# Patient Record
Sex: Female | Born: 1995 | Race: Black or African American | Hispanic: No | Marital: Single | Smoking: Never smoker
Health system: Southern US, Community
[De-identification: ages and names within clinical notes are randomized; demographics above are authoritative.]

## PROBLEM LIST (undated history)

## (undated) DIAGNOSIS — G43909 Migraine, unspecified, not intractable, without status migrainosus: Secondary | ICD-10-CM

## (undated) DIAGNOSIS — R0600 Dyspnea, unspecified: Secondary | ICD-10-CM

## (undated) DIAGNOSIS — D6832 Hemorrhagic disorder due to extrinsic circulating anticoagulants: Secondary | ICD-10-CM

## (undated) DIAGNOSIS — F329 Major depressive disorder, single episode, unspecified: Secondary | ICD-10-CM

## (undated) DIAGNOSIS — D689 Coagulation defect, unspecified: Secondary | ICD-10-CM

## (undated) DIAGNOSIS — I82409 Acute embolism and thrombosis of unspecified deep veins of unspecified lower extremity: Secondary | ICD-10-CM

## (undated) DIAGNOSIS — F988 Other specified behavioral and emotional disorders with onset usually occurring in childhood and adolescence: Secondary | ICD-10-CM

## (undated) DIAGNOSIS — F32A Depression, unspecified: Secondary | ICD-10-CM

## (undated) DIAGNOSIS — D573 Sickle-cell trait: Secondary | ICD-10-CM

## (undated) DIAGNOSIS — N19 Unspecified kidney failure: Secondary | ICD-10-CM

## (undated) DIAGNOSIS — D6862 Lupus anticoagulant syndrome: Secondary | ICD-10-CM

## (undated) DIAGNOSIS — M419 Scoliosis, unspecified: Secondary | ICD-10-CM

## (undated) DIAGNOSIS — D649 Anemia, unspecified: Secondary | ICD-10-CM

## (undated) DIAGNOSIS — T45515A Adverse effect of anticoagulants, initial encounter: Secondary | ICD-10-CM

## (undated) HISTORY — DX: Acute embolism and thrombosis of unspecified deep veins of unspecified lower extremity: I82.409

## (undated) HISTORY — DX: Scoliosis, unspecified: M41.9

## (undated) HISTORY — PX: INCISE AND DRAIN ABCESS: PRO64

## (undated) HISTORY — DX: Dyspnea, unspecified: R06.00

## (undated) HISTORY — DX: Lupus anticoagulant syndrome: D68.62

## (undated) HISTORY — DX: Sickle-cell trait: D57.3

## (undated) HISTORY — DX: Major depressive disorder, single episode, unspecified: F32.9

## (undated) HISTORY — DX: Hemorrhagic disorder due to extrinsic circulating anticoagulants: D68.32

## (undated) HISTORY — DX: Migraine, unspecified, not intractable, without status migrainosus: G43.909

## (undated) HISTORY — DX: Anemia, unspecified: D64.9

## (undated) HISTORY — DX: Depression, unspecified: F32.A

## (undated) HISTORY — DX: Other specified behavioral and emotional disorders with onset usually occurring in childhood and adolescence: F98.8

## (undated) HISTORY — DX: Unspecified kidney failure: N19

## (undated) HISTORY — DX: Adverse effect of anticoagulants, initial encounter: T45.515A

---
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
## 2014-02-18 ENCOUNTER — Ambulatory Visit: Payer: Self-pay | Admitting: Emergency Medicine

## 2014-02-24 ENCOUNTER — Other Ambulatory Visit: Payer: Self-pay

## 2014-02-24 MED ORDER — WARFARIN SODIUM 5 MG PO TABS: ORAL_TABLET | ORAL | Status: DC

## 2014-02-24 NOTE — Telephone Encounter (Signed)
Patient out of town and requesting Coumadin 1 po twice daily, per Quentin MullingAmanda Collier, [place] , ok to send in RX , #60 , 0 refills

## 2014-03-11 ENCOUNTER — Encounter: Payer: Self-pay | Admitting: Emergency Medicine

## 2014-03-11 ENCOUNTER — Encounter (INDEPENDENT_AMBULATORY_CARE_PROVIDER_SITE_OTHER): Payer: Self-pay

## 2014-03-11 ENCOUNTER — Ambulatory Visit (INDEPENDENT_AMBULATORY_CARE_PROVIDER_SITE_OTHER): Payer: BLUE CROSS/BLUE SHIELD | Admitting: Emergency Medicine

## 2014-03-11 ENCOUNTER — Other Ambulatory Visit: Payer: Self-pay | Admitting: Internal Medicine

## 2014-03-11 VITALS — BP 124/60 | HR 72 | Temp 98.2°F | Resp 16 | Ht 64.75 in | Wt 212.0 lb

## 2014-03-11 DIAGNOSIS — Z79899 Other long term (current) drug therapy: Secondary | ICD-10-CM

## 2014-03-11 DIAGNOSIS — R5383 Other fatigue: Secondary | ICD-10-CM

## 2014-03-11 DIAGNOSIS — R5381 Other malaise: Secondary | ICD-10-CM

## 2014-03-11 MED ORDER — WARFARIN SODIUM 5 MG PO TABS: ORAL_TABLET | ORAL | Status: DC

## 2014-03-11 MED ORDER — ALPRAZOLAM 0.5 MG PO TABS: 0.5000 mg | ORAL_TABLET | Freq: Two times a day (BID) | ORAL | Status: DC | PRN

## 2014-03-11 NOTE — Progress Notes (Signed)
Subjective:     Patient ID: Anna Butler, female   DOB: 04/09/1995, 19 y.o.   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 Coumadin has been checked at school and is in range.  She has started xanax 0.25 mg sporadically for anxiety 2-3 times a week which has helped. She has been off Wellbutrin over several months. She will be studying abroad and has requested a letter regarding these areas be emailed to her.She otherwise is doing well without concerns except mild fatigue if does not get enough sleep  Lab Results      Component                Value               Date                      WBC                      5.5                 06/23/2013                HGB                      12.1                06/23/2013                HCT                      36.7                06/23/2013                PLT                      379                 06/23/2013                GLUCOSE                  77                  08/06/2013                CHOL                     157                 06/23/2013                TRIG                     117                 06/23/2013                HDL                      40                  06/23/2013  LDLCALC                  94                  06/23/2013                ALT                      25                  06/23/2013                AST                      21                  06/23/2013                NA                       139                 08/06/2013                K                        4.3                 08/06/2013                CL                       105                 08/06/2013                CREATININE               0.66                08/06/2013                BUN                      16                  08/06/2013                CO2                      22                  08/06/2013                TSH                      1.816                06/23/2013                INR                      2.14*               10/24/2011                HGBA1C  5.4                 06/23/2013                MICROALBUR               0.50                12/06/2009               Medication List       This list is accurate as of: 03/11/14 11:59 PM.  Always use your most recent med list.               ALPRAZolam 0.5 MG tablet  Commonly known as:  XANAX  Take 1 tablet (0.5 mg total) by mouth 2 (two) times daily as needed for anxiety.     warfarin 5 MG tablet  Commonly known as:  COUMADIN  Take 2 tablets daily       No Known Allergies Past Medical History  Diagnosis Date  . Sickle cell trait   . Dyspnea Oct 2011  . Renal failure Oct 2011  . DVT (deep venous thrombosis) Oct 2011  . Clotting disorder   . Anemia   . Scoliosis   . Lupus anticoagulant with hypercoagulable state 2011  . Migraines   . ADD (attention deficit disorder)   . Depression      Review of Systems  Respiratory: Negative for shortness of breath.   Cardiovascular: Negative for chest pain.  Hematological: Does not bruise/bleed easily.  All other systems reviewed and are negative.  BP 124/60 mmHg  Pulse 72  Temp(Src) 98.2 F (36.8 C) (Temporal)  Resp 16  Ht 5' 4.75" (1.645 m)  Wt 212 lb (96.163 kg)  BMI 35.54 kg/m2  LMP 02/18/2014     Objective:   Physical Exam  Constitutional: She is oriented to person, place, and time. She appears well-developed and well-nourished. No distress.  HENT:  Head: Normocephalic and atraumatic.  Right Ear: External ear normal.  Left Ear: External ear normal.  Nose: Nose normal.  Eyes: Conjunctivae and EOM are normal.  Neck: Normal range of motion. Neck supple. No JVD present. No thyromegaly present.  Cardiovascular: Normal rate, regular rhythm, normal heart sounds and intact distal pulses.   Pulmonary/Chest: Effort normal and breath sounds normal.  Abdominal: Soft. Bowel sounds are normal. She exhibits  no distension. There is no tenderness.  Musculoskeletal: Normal range of motion. She exhibits no edema or tenderness.  Lymphadenopathy:    She has no cervical adenopathy.  Neurological: She is alert and oriented to person, place, and time. No cranial nerve deficit.  Skin: Skin is warm and dry. No rash noted. No erythema. No pallor.  Psychiatric: She has a normal mood and affect. Her behavior is normal. Judgment and thought content normal.  Nursing note and vitals reviewed.      Assessment:     Encounter for long-term (current) use of medications - Plan: Protime-INR  Malaise and fatigue - Plan: CBC with Differential, BASIC METABOLIC PANEL WITH GFR, Hepatic function panel, TSH       Plan:     1.DVT prophylaxis- Check labs, Call office with any unusual bleeding/ bruising/ weakness- will give rx x 5 months with upcoming trip  2. Anxiety- Controlled currently, continue RX AD w/c if SX increase or ER, recommend counseling if symptoms continue

## 2014-03-11 NOTE — Patient Instructions (Signed)
Generalized Anxiety Disorder Generalized anxiety disorder (GAD) is a mental disorder. It interferes with life functions, including relationships, work, and school. GAD is different from normal anxiety, which everyone experiences at some point in their lives in response to specific life events and activities. Normal anxiety actually helps us prepare for and get through these life events and activities. Normal anxiety goes away after the event or activity is over.  GAD causes anxiety that is not necessarily related to specific events or activities. It also causes excess anxiety in proportion to specific events or activities. The anxiety associated with GAD is also difficult to control. GAD can vary from mild to severe. People with severe GAD can have intense waves of anxiety with physical symptoms (panic attacks).  SYMPTOMS The anxiety and worry associated with GAD are difficult to control. This anxiety and worry are related to many life events and activities and also occur more days than not for 6 months or longer. People with GAD also have three or more of the following symptoms (one or more in children):  Restlessness.   Fatigue.  Difficulty concentrating.   Irritability.  Muscle tension.  Difficulty sleeping or unsatisfying sleep. DIAGNOSIS GAD is diagnosed through an assessment by your health care provider. Your health care provider will ask you questions aboutyour mood,physical symptoms, and events in your life. Your health care provider may ask you about your medical history and use of alcohol or drugs, including prescription medicines. Your health care provider may also do a physical exam and blood tests. Certain medical conditions and the use of certain substances can cause symptoms similar to those associated with GAD. Your health care provider may refer you to a mental health specialist for further evaluation. TREATMENT The following therapies are usually used to treat GAD:    Medication. Antidepressant medication usually is prescribed for long-term daily control. Antianxiety medicines may be added in severe cases, especially when panic attacks occur.   Talk therapy (psychotherapy). Certain types of talk therapy can be helpful in treating GAD by providing support, education, and guidance. A form of talk therapy called cognitive behavioral therapy can teach you healthy ways to think about and react to daily life events and activities.  Stress managementtechniques. These include yoga, meditation, and exercise and can be very helpful when they are practiced regularly. A mental health specialist can help determine which treatment is best for you. Some people see improvement with one therapy. However, other people require a combination of therapies. Document Released: 06/17/2012 Document Revised: 07/07/2013 Document Reviewed: 06/17/2012 [employer] Patient Information 2015 [employer]. This information is not intended to replace advice given to you by your health care provider. Make sure you discuss any questions you have with your health care provider.  

## 2014-03-12 LAB — BASIC METABOLIC PANEL WITH GFR
BUN: 13 mg/dL (ref 6–23)
CALCIUM: 9.2 mg/dL (ref 8.4–10.5)
CO2: 26 meq/L (ref 19–32)
CREATININE: 0.53 mg/dL (ref 0.50–1.10)
Chloride: 105 mEq/L (ref 96–112)
GFR, Est African American: >89 mL/min
GFR, Est Non African American: >89 mL/min
Glucose, Bld: 80 mg/dL (ref 70–99)
Potassium: 4.1 mEq/L (ref 3.5–5.3)
Sodium: 138 mEq/L (ref 135–145)

## 2014-03-12 LAB — TSH: TSH: 0.779 u[IU]/mL (ref 0.350–4.500)

## 2014-03-12 LAB — CBC WITH DIFFERENTIAL/PLATELET
BASOS ABS: 0.1 10*3/uL (ref 0.0–0.1)
BASOS PCT: 1 % (ref 0–1)
EOS ABS: 0.1 10*3/uL (ref 0.0–0.7)
EOS PCT: 1 % (ref 0–5)
HCT: 40.6 % (ref 36.0–46.0)
HEMOGLOBIN: 13.4 g/dL (ref 12.0–15.0)
LYMPHS ABS: 1.7 10*3/uL (ref 0.7–4.0)
LYMPHS PCT: 34 % (ref 12–46)
MCH: 26 pg (ref 26.0–34.0)
MCHC: 33 g/dL (ref 30.0–36.0)
MCV: 78.8 fL (ref 78.0–100.0)
MPV: 10 fL (ref 8.6–12.4)
Monocytes Absolute: 0.5 10*3/uL (ref 0.1–1.0)
Monocytes Relative: 10 % (ref 3–12)
NEUTROS PCT: 54 % (ref 43–77)
Neutro Abs: 2.7 10*3/uL (ref 1.7–7.7)
Platelets: 371 10*3/uL (ref 150–400)
RBC: 5.15 MIL/uL — ABNORMAL HIGH (ref 3.87–5.11)
RDW: 16.1 % — ABNORMAL HIGH (ref 11.5–15.5)
WBC: 5 10*3/uL (ref 4.0–10.5)

## 2014-03-12 LAB — HEPATIC FUNCTION PANEL
ALT: 21 U/L (ref 0–35)
AST: 20 U/L (ref 0–37)
Albumin: 4.6 g/dL (ref 3.5–5.2)
Alkaline Phosphatase: 46 U/L (ref 39–117)
BILIRUBIN DIRECT: 0.1 mg/dL (ref 0.0–0.3)
BILIRUBIN TOTAL: 0.3 mg/dL (ref 0.2–1.1)
Indirect Bilirubin: 0.2 mg/dL (ref 0.2–1.1)
Total Protein: 7.7 g/dL (ref 6.0–8.3)

## 2014-03-12 LAB — PROTIME-INR
INR: 1.97 — AB (ref <1.50)
Prothrombin Time: 22.4 seconds — ABNORMAL HIGH (ref 11.6–15.2)

## 2014-03-13 ENCOUNTER — Encounter: Payer: Self-pay | Admitting: Emergency Medicine

## 2014-03-16 ENCOUNTER — Encounter: Payer: Self-pay | Admitting: *Deleted

## 2014-04-07 ENCOUNTER — Encounter: Payer: Self-pay | Admitting: Emergency Medicine

## 2014-06-24 ENCOUNTER — Encounter: Payer: Self-pay | Admitting: Emergency Medicine

## 2014-08-19 ENCOUNTER — Encounter: Payer: Self-pay | Admitting: Internal Medicine

## 2014-08-19 ENCOUNTER — Ambulatory Visit (INDEPENDENT_AMBULATORY_CARE_PROVIDER_SITE_OTHER): Payer: BLUE CROSS/BLUE SHIELD | Admitting: Internal Medicine

## 2014-08-19 VITALS — BP 116/64 | HR 74 | Temp 98.4°F | Resp 18 | Ht 64.75 in | Wt 186.0 lb

## 2014-08-19 DIAGNOSIS — Z79899 Other long term (current) drug therapy: Secondary | ICD-10-CM

## 2014-08-19 DIAGNOSIS — Z0000 Encounter for general adult medical examination without abnormal findings: Secondary | ICD-10-CM

## 2014-08-19 DIAGNOSIS — Z131 Encounter for screening for diabetes mellitus: Secondary | ICD-10-CM

## 2014-08-19 DIAGNOSIS — R5383 Other fatigue: Secondary | ICD-10-CM

## 2014-08-19 DIAGNOSIS — Z13220 Encounter for screening for lipoid disorders: Secondary | ICD-10-CM

## 2014-08-19 DIAGNOSIS — Z7901 Long term (current) use of anticoagulants: Secondary | ICD-10-CM

## 2014-08-19 DIAGNOSIS — IMO0001 Reserved for inherently not codable concepts without codable children: Secondary | ICD-10-CM

## 2014-08-19 DIAGNOSIS — F411 Generalized anxiety disorder: Secondary | ICD-10-CM

## 2014-08-19 DIAGNOSIS — E559 Vitamin D deficiency, unspecified: Secondary | ICD-10-CM

## 2014-08-19 DIAGNOSIS — Z1389 Encounter for screening for other disorder: Secondary | ICD-10-CM

## 2014-08-19 DIAGNOSIS — I82409 Acute embolism and thrombosis of unspecified deep veins of unspecified lower extremity: Secondary | ICD-10-CM

## 2014-08-19 LAB — CBC WITH DIFFERENTIAL/PLATELET
Basophils Absolute: 0 10*3/uL (ref 0.0–0.1)
Basophils Relative: 0 % (ref 0–1)
Eosinophils Absolute: 0.1 10*3/uL (ref 0.0–0.7)
Eosinophils Relative: 2 % (ref 0–5)
HEMATOCRIT: 37.5 % (ref 36.0–46.0)
Hemoglobin: 12.2 g/dL (ref 12.0–15.0)
Lymphocytes Relative: 39 % (ref 12–46)
Lymphs Abs: 1.1 10*3/uL (ref 0.7–4.0)
MCH: 26.9 pg (ref 26.0–34.0)
MCHC: 32.5 g/dL (ref 30.0–36.0)
MCV: 82.6 fL (ref 78.0–100.0)
MONO ABS: 0.3 10*3/uL (ref 0.1–1.0)
MONOS PCT: 10 % (ref 3–12)
MPV: 9.1 fL (ref 8.6–12.4)
NEUTROS ABS: 1.4 10*3/uL — AB (ref 1.7–7.7)
Neutrophils Relative %: 49 % (ref 43–77)
Platelets: 366 10*3/uL (ref 150–400)
RBC: 4.54 MIL/uL (ref 3.87–5.11)
RDW: 16.1 % — AB (ref 11.5–15.5)
WBC: 2.8 10*3/uL — ABNORMAL LOW (ref 4.0–10.5)

## 2014-08-19 LAB — HEPATIC FUNCTION PANEL
ALT: 18 U/L (ref 0–35)
AST: 19 U/L (ref 0–37)
Albumin: 4.4 g/dL (ref 3.5–5.2)
Alkaline Phosphatase: 42 U/L (ref 39–117)
Bilirubin, Direct: 0.1 mg/dL (ref 0.0–0.3)
Indirect Bilirubin: 0.2 mg/dL (ref 0.2–1.2)
Total Bilirubin: 0.3 mg/dL (ref 0.2–1.2)
Total Protein: 7.4 g/dL (ref 6.0–8.3)

## 2014-08-19 LAB — BASIC METABOLIC PANEL WITH GFR
BUN: 11 mg/dL (ref 6–23)
CALCIUM: 9.1 mg/dL (ref 8.4–10.5)
CO2: 23 meq/L (ref 19–32)
CREATININE: 0.6 mg/dL (ref 0.50–1.10)
Chloride: 105 mEq/L (ref 96–112)
GFR, Est Non African American: >89 mL/min
GLUCOSE: 77 mg/dL (ref 70–99)
Potassium: 4.1 mEq/L (ref 3.5–5.3)
SODIUM: 135 meq/L (ref 135–145)

## 2014-08-19 LAB — LIPID PANEL
CHOL/HDL RATIO: 3.3 ratio
Cholesterol: 148 mg/dL (ref 0–200)
HDL: 45 mg/dL — ABNORMAL LOW (ref >=46)
LDL Cholesterol: 93 mg/dL (ref 0–99)
TRIGLYCERIDES: 50 mg/dL (ref <150)
VLDL: 10 mg/dL (ref 0–40)

## 2014-08-19 LAB — IRON AND TIBC
%SAT: 10 % — AB (ref 20–55)
Iron: 33 ug/dL — ABNORMAL LOW (ref 42–145)
TIBC: 325 ug/dL (ref 250–470)
UIBC: 292 ug/dL (ref 125–400)

## 2014-08-19 LAB — MAGNESIUM: Magnesium: 1.8 mg/dL (ref 1.5–2.5)

## 2014-08-19 MED ORDER — SERTRALINE HCL 50 MG PO TABS: ORAL_TABLET | ORAL | Status: DC

## 2014-08-19 MED ORDER — ALPRAZOLAM 0.5 MG PO TABS: 0.5000 mg | ORAL_TABLET | Freq: Two times a day (BID) | ORAL | Status: DC | PRN

## 2014-08-19 NOTE — Patient Instructions (Signed)
Preventive Care for Adults A healthy lifestyle and preventive care can promote health and wellness. Preventive health guidelines for women include the following key practices.  A routine yearly physical is a good way to check with your health care provider about your health and preventive screening. It is a chance to share any concerns and updates on your health and to receive a thorough exam.  Visit your dentist for a routine exam and preventive care every 6 months. Brush your teeth twice a day and floss once a day. Good oral hygiene prevents tooth decay and gum disease.  The frequency of eye exams is based on your age, health, family medical history, use of contact lenses, and other factors. Follow your health care provider's recommendations for frequency of eye exams.  Eat a healthy diet. Foods like vegetables, fruits, whole grains, low-fat dairy products, and lean protein foods contain the nutrients you need without too many calories. Decrease your intake of foods high in solid fats, added sugars, and salt. Eat the right amount of calories for you.Get information about a proper diet from your health care provider, if necessary.  Regular physical exercise is one of the most important things you can do for your health. Most adults should get at least 150 minutes of moderate-intensity exercise (any activity that increases your heart rate and causes you to sweat) each week. In addition, most adults need muscle-strengthening exercises on 2 or more days a week.  Maintain a healthy weight. The body mass index (BMI) is a screening tool to identify possible weight problems. It provides an estimate of body fat based on height and weight. Your health care provider can find your BMI and can help you achieve or maintain a healthy weight.For adults 20 years and older:  A BMI below 18.5 is considered underweight.  A BMI of 18.5 to 24.9 is normal.  A BMI of 25 to 29.9 is considered overweight.  A BMI of  30 and above is considered obese.  Maintain normal blood lipids and cholesterol levels by exercising and minimizing your intake of saturated fat. Eat a balanced diet with plenty of fruit and vegetables. Blood tests for lipids and cholesterol should begin at age 20 and be repeated every 5 years. If your lipid or cholesterol levels are high, you are over 50, or you are at high risk for heart disease, you may need your cholesterol levels checked more frequently.Ongoing high lipid and cholesterol levels should be treated with medicines if diet and exercise are not working.  If you smoke, find out from your health care provider how to quit. If you do not use tobacco, do not start.  Lung cancer screening is recommended for adults aged 55-80 years who are at high risk for developing lung cancer because of a history of smoking. A yearly low-dose CT scan of the lungs is recommended for people who have at least a 30-pack-year history of smoking and are a current smoker or have quit within the past 15 years. A pack year of smoking is smoking an average of 1 pack of cigarettes a day for 1 year (for example: 1 pack a day for 30 years or 2 packs a day for 15 years). Yearly screening should continue until the smoker has stopped smoking for at least 15 years. Yearly screening should be stopped for people who develop a health problem that would prevent them from having lung cancer treatment.  If you are pregnant, do not drink alcohol. If you are breastfeeding,   breastfeeding, be very cautious about drinking alcohol. If you are not pregnant and choose to drink alcohol, do not have more than 1 drink per day. One drink is considered to be 12 ounces (355 mL) of beer, 5 ounces (148 mL) of wine, or 1.5 ounces (44 mL) of liquor.  Avoid use of street drugs. Do not share needles with anyone. Ask for help if you need support or instructions about stopping the use of drugs.  High blood pressure causes heart disease and increases the risk of  stroke. Your blood pressure should be checked at least every 1 to 2 years. Ongoing high blood pressure should be treated with medicines if weight loss and exercise do not work.  If you are 3-31 years old, ask your health care provider if you should take aspirin to prevent strokes.  Diabetes screening involves taking a blood sample to check your fasting blood sugar level. This should be done once every 3 years, after age 31, if you are within normal weight and without risk factors for diabetes. Testing should be considered at a younger age or be carried out more frequently if you are overweight and have at least 1 risk factor for diabetes.  Breast cancer screening is essential preventive care for women. You should practice "breast self-awareness." This means understanding the normal appearance and feel of your breasts and may include breast self-examination. Any changes detected, no matter how small, should be reported to a health care provider. Women in their 76s and 30s should have a clinical breast exam (CBE) by a health care provider as part of a regular health exam every 1 to 3 years. After age 65, women should have a CBE every year. Starting at age 67, women should consider having a mammogram (breast X-ray test) every year. Women who have a family history of breast cancer should talk to their health care provider about genetic screening. Women at a high risk of breast cancer should talk to their health care providers about having an MRI and a mammogram every year.  Breast cancer gene (BRCA)-related cancer risk assessment is recommended for women who have family members with BRCA-related cancers. BRCA-related cancers include breast, ovarian, tubal, and peritoneal cancers. Having family members with these cancers may be associated with an increased risk for harmful changes (mutations) in the breast cancer genes BRCA1 and BRCA2. Results of the assessment will determine the need for genetic counseling and  BRCA1 and BRCA2 testing.  Routine pelvic exams to screen for cancer are no longer recommended for nonpregnant women who are considered low risk for cancer of the pelvic organs (ovaries, uterus, and vagina) and who do not have symptoms. Ask your health care provider if a screening pelvic exam is right for you.  If you have had past treatment for cervical cancer or a condition that could lead to cancer, you need Pap tests and screening for cancer for at least 20 years after your treatment. If Pap tests have been discontinued, your risk factors (such as having a new sexual partner) need to be reassessed to determine if screening should be resumed. Some women have medical problems that increase the chance of getting cervical cancer. In these cases, your health care provider may recommend more frequent screening and Pap tests.  The HPV test is an additional test that may be used for cervical cancer screening. The HPV test looks for the virus that can cause the cell changes on the cervix. The cells collected during the Pap test can  be tested for HPV. The HPV test could be used to screen women aged 98 years and older, and should be used in women of any age who have unclear Pap test results. After the age of 59, women should have HPV testing at the same frequency as a Pap test.  Colorectal cancer can be detected and often prevented. Most routine colorectal cancer screening begins at the age of 35 years and continues through age 61 years. However, your health care provider may recommend screening at an earlier age if you have risk factors for colon cancer. On a yearly basis, your health care provider may provide home test kits to check for hidden blood in the stool. Use of a small camera at the end of a tube, to directly examine the colon (sigmoidoscopy or colonoscopy), can detect the earliest forms of colorectal cancer. Talk to your health care provider about this at age 18, when routine screening begins. Direct  exam of the colon should be repeated every 5-10 years through age 67 years, unless early forms of pre-cancerous polyps or small growths are found.  People who are at an increased risk for hepatitis B should be screened for this virus. You are considered at high risk for hepatitis B if:  You were born in a country where hepatitis B occurs often. Talk with your health care provider about which countries are considered high risk.  Your parents were born in a high-risk country and you have not received a shot to protect against hepatitis B (hepatitis B vaccine).  You have HIV or AIDS.  You use needles to inject street drugs.  You live with, or have sex with, someone who has hepatitis B.  You get hemodialysis treatment.  You take certain medicines for conditions like cancer, organ transplantation, and autoimmune conditions.  Hepatitis C blood testing is recommended for all people born from [birth year] through 1965 and any individual with known risks for hepatitis C.  Practice safe sex. Use condoms and avoid high-risk sexual practices to reduce the spread of sexually transmitted infections (STIs). STIs include gonorrhea, chlamydia, syphilis, trichomonas, herpes, HPV, and human immunodeficiency virus (HIV). Herpes, HIV, and HPV are viral illnesses that have no cure. They can result in disability, cancer, and death.  You should be screened for sexually transmitted illnesses (STIs) including gonorrhea and chlamydia if:  You are sexually active and are younger than 24 years.  You are older than 24 years and your health care provider tells you that you are at risk for this type of infection.  Your sexual activity has changed since you were last screened and you are at an increased risk for chlamydia or gonorrhea. Ask your health care provider if you are at risk.  If you are at risk of being infected with HIV, it is recommended that you take a prescription medicine daily to prevent HIV infection. This is  called preexposure prophylaxis (PrEP). You are considered at risk if:  You are a heterosexual woman, are sexually active, and are at increased risk for HIV infection.  You take drugs by injection.  You are sexually active with a partner who has HIV.  Talk with your health care provider about whether you are at high risk of being infected with HIV. If you choose to begin PrEP, you should first be tested for HIV. You should then be tested every 3 months for as long as you are taking PrEP.  Osteoporosis is a disease in which the bones lose minerals and  strength with aging. This can result in serious bone fractures or breaks. The risk of osteoporosis can be identified using a bone density scan. Women ages 48 years and over and women at risk for fractures or osteoporosis should discuss screening with their health care providers. Ask your health care provider whether you should take a calcium supplement or vitamin D to reduce the rate of osteoporosis.  Menopause can be associated with physical symptoms and risks. Hormone replacement therapy is available to decrease symptoms and risks. You should talk to your health care provider about whether hormone replacement therapy is right for you.  Use sunscreen. Apply sunscreen liberally and repeatedly throughout the day. You should seek shade when your shadow is shorter than you. Protect yourself by wearing long sleeves, pants, a wide-brimmed hat, and sunglasses year round, whenever you are outdoors.  Once a month, do a whole body skin exam, using a mirror to look at the skin on your back. Tell your health care provider of new moles, moles that have irregular borders, moles that are larger than a pencil eraser, or moles that have changed in shape or color.  Stay current with required vaccines (immunizations).  Influenza vaccine. All adults should be immunized every year.  Tetanus, diphtheria, and acellular pertussis (Td, Tdap) vaccine. Pregnant women should  receive 1 dose of Tdap vaccine during each pregnancy. The dose should be obtained regardless of the length of time since the last dose. Immunization is preferred during the 27th-36th week of gestation. An adult who has not previously received Tdap or who does not know her vaccine status should receive 1 dose of Tdap. This initial dose should be followed by tetanus and diphtheria toxoids (Td) booster doses every 10 years. Adults with an unknown or incomplete history of completing a 3-dose immunization series with Td-containing vaccines should begin or complete a primary immunization series including a Tdap dose. Adults should receive a Td booster every 10 years.  Varicella vaccine. An adult without evidence of immunity to varicella should receive 2 doses or a second dose if she has previously received 1 dose. Pregnant females who do not have evidence of immunity should receive the first dose after pregnancy. This first dose should be obtained before leaving the health care facility. The second dose should be obtained 4-8 weeks after the first dose.  Human papillomavirus (HPV) vaccine. Females aged 13-26 years who have not received the vaccine previously should obtain the 3-dose series. The vaccine is not recommended for use in pregnant females. However, pregnancy testing is not needed before receiving a dose. If a female is found to be pregnant after receiving a dose, no treatment is needed. In that case, the remaining doses should be delayed until after the pregnancy. Immunization is recommended for any person with an immunocompromised condition through the age of 48 years if she did not get any or all doses earlier. During the 3-dose series, the second dose should be obtained 4-8 weeks after the first dose. The third dose should be obtained 24 weeks after the first dose and 16 weeks after the second dose.  Zoster vaccine. One dose is recommended for adults aged 67 years or older unless certain conditions are  present.  Measles, mumps, and rubella (MMR) vaccine. Adults born before [birth year] generally are considered immune to measles and mumps. Adults born in [birth year] or later should have 1 or more doses of MMR vaccine unless there is a contraindication to the vaccine or there is laboratory evidence of immunity  to each of the three diseases. A routine second dose of MMR vaccine should be obtained at least 28 days after the first dose for students attending postsecondary schools, health care workers, or international travelers. People who received inactivated measles vaccine or an unknown type of measles vaccine during 1963-1967 should receive 2 doses of MMR vaccine. People who received inactivated mumps vaccine or an unknown type of mumps vaccine before 1979 and are at high risk for mumps infection should consider immunization with 2 doses of MMR vaccine. For females of childbearing age, rubella immunity should be determined. If there is no evidence of immunity, females who are not pregnant should be vaccinated. If there is no evidence of immunity, females who are pregnant should delay immunization until after pregnancy. Unvaccinated health care workers born before [birth year] who lack laboratory evidence of measles, mumps, or rubella immunity or laboratory confirmation of disease should consider measles and mumps immunization with 2 doses of MMR vaccine or rubella immunization with 1 dose of MMR vaccine.  Pneumococcal 13-valent conjugate (PCV13) vaccine. When indicated, a person who is uncertain of her immunization history and has no record of immunization should receive the PCV13 vaccine. An adult aged 58 years or older who has certain medical conditions and has not been previously immunized should receive 1 dose of PCV13 vaccine. This PCV13 should be followed with a dose of pneumococcal polysaccharide (PPSV23) vaccine. The PPSV23 vaccine dose should be obtained at least 8 weeks after the dose of PCV13 vaccine. An adult aged 65  years or older who has certain medical conditions and previously received 1 or more doses of PPSV23 vaccine should receive 1 dose of PCV13. The PCV13 vaccine dose should be obtained 1 or more years after the last PPSV23 vaccine dose.  Pneumococcal polysaccharide (PPSV23) vaccine. When PCV13 is also indicated, PCV13 should be obtained first. All adults aged 18 years and older should be immunized. An adult younger than age 74 years who has certain medical conditions should be immunized. Any person who resides in a nursing home or long-term care facility should be immunized. An adult smoker should be immunized. People with an immunocompromised condition and certain other conditions should receive both PCV13 and PPSV23 vaccines. People with human immunodeficiency virus (HIV) infection should be immunized as soon as possible after diagnosis. Immunization during chemotherapy or radiation therapy should be avoided. Routine use of PPSV23 vaccine is not recommended for American Indians, [place] Natives, or people younger than 65 years unless there are medical conditions that require PPSV23 vaccine. When indicated, people who have unknown immunization and have no record of immunization should receive PPSV23 vaccine. One-time revaccination 5 years after the first dose of PPSV23 is recommended for people aged 19-64 years who have chronic kidney failure, nephrotic syndrome, asplenia, or immunocompromised conditions. People who received 1-2 doses of PPSV23 before age 21 years should receive another dose of PPSV23 vaccine at age 42 years or later if at least 5 years have passed since the previous dose. Doses of PPSV23 are not needed for people immunized with PPSV23 at or after age 61 years.  Meningococcal vaccine. Adults with asplenia or persistent complement component deficiencies should receive 2 doses of quadrivalent meningococcal conjugate (MenACWY-D) vaccine. The doses should be obtained at least 2 months apart.  Microbiologists working with certain meningococcal bacteria, [employer], people at risk during an outbreak, and people who travel to or live in countries with a high rate of meningitis should be immunized. A first-year college student up through  age 23 years who is living in a residence hall should receive a dose if she did not receive a dose on or after her 16th birthday. Adults who have certain high-risk conditions should receive one or more doses of vaccine.  Hepatitis A vaccine. Adults who wish to be protected from this disease, have certain high-risk conditions, work with hepatitis A-infected animals, work in hepatitis A research labs, or travel to or work in countries with a high rate of hepatitis A should be immunized. Adults who were previously unvaccinated and who anticipate close contact with an international adoptee during the first 60 days after arrival in the [place] from a country with a high rate of hepatitis A should be immunized.  Hepatitis B vaccine. Adults who wish to be protected from this disease, have certain high-risk conditions, may be exposed to blood or other infectious body fluids, are household contacts or sex partners of hepatitis B positive people, are clients or workers in certain care facilities, or travel to or work in countries with a high rate of hepatitis B should be immunized.  Haemophilus influenzae type b (Hib) vaccine. A previously unvaccinated person with asplenia or sickle cell disease or having a scheduled splenectomy should receive 1 dose of Hib vaccine. Regardless of previous immunization, a recipient of a hematopoietic stem cell transplant should receive a 3-dose series 6-12 months after her successful transplant. Hib vaccine is not recommended for adults with HIV infection. Preventive Services / Frequency  Ages 65 to 22 years  Blood pressure check.  Lipid and cholesterol check.  Clinical breast exam.** / Every 3 years for women in their 53s  and 35s.  BRCA-related cancer risk assessment.** / For women who have family members with a BRCA-related cancer (breast, ovarian, tubal, or peritoneal cancers).  Pap test.** / Every 2 years from ages 41 through 44. Every 3 years starting at age 64 through age 28 or 21 with a history of 3 consecutive normal Pap tests.  HPV screening.** / Every 3 years from ages 9 through ages 56 to 48 with a history of 3 consecutive normal Pap tests.  Hepatitis C blood test.** / For any individual with known risks for hepatitis C.  Skin self-exam. / Monthly.  Influenza vaccine. / Every year.  Tetanus, diphtheria, and acellular pertussis (Tdap, Td) vaccine.** / Consult your health care provider. Pregnant women should receive 1 dose of Tdap vaccine during each pregnancy. 1 dose of Td every 10 years.  Varicella vaccine.** / Consult your health care provider. Pregnant females who do not have evidence of immunity should receive the first dose after pregnancy.  HPV vaccine. / 3 doses over 6 months, if 67 and younger. The vaccine is not recommended for use in pregnant females. However, pregnancy testing is not needed before receiving a dose.  Measles, mumps, rubella (MMR) vaccine.** / You need at least 1 dose of MMR if you were born in 1957 or later. You may also need a 2nd dose. For females of childbearing age, rubella immunity should be determined. If there is no evidence of immunity, females who are not pregnant should be vaccinated. If there is no evidence of immunity, females who are pregnant should delay immunization until after pregnancy.  Pneumococcal 13-valent conjugate (PCV13) vaccine.** / Consult your health care provider.  Pneumococcal polysaccharide (PPSV23) vaccine.** / 1 to 2 doses if you smoke cigarettes or if you have certain conditions.  Meningococcal vaccine.** / 1 dose if you are age 61 to 21 years and  a Market researcher living in a residence hall, or have one of several medical  conditions, you need to get vaccinated against meningococcal disease. You may also need additional booster doses.  Hepatitis A vaccine.** / Consult your health care provider.  Hepatitis B vaccine.** / Consult your health care provider.  Haemophilus influenzae type b (Hib) vaccine.** / Consult your health care provider.   Rivaroxaban oral tablets What is this medicine? RIVAROXABAN (ri va ROX a ban) is an anticoagulant (blood thinner). It is used to treat blood clots in the lungs or in the veins. It is also used after knee or hip surgeries to prevent blood clots. It is also used to lower the chance of stroke in people with a medical condition called atrial fibrillation. This medicine may be used for other purposes; ask your health care provider or pharmacist if you have questions. COMMON BRAND NAME(S): Xarelto, Xarelto Starter Pack What should I tell my health care provider before I take this medicine? They need to know if you have any of these conditions: -bleeding disorders -bleeding in the brain -blood in your stools (black or tarry stools) or if you have blood in your vomit -history of stomach bleeding -kidney disease -liver disease -low blood counts, like low white cell, platelet, or red cell counts -recent or planned spinal or epidural procedure -take medicines that treat or prevent blood clots -an unusual or allergic reaction to rivaroxaban, other medicines, foods, dyes, or preservatives -pregnant or trying to get pregnant -breast-feeding How should I use this medicine? Take this medicine by mouth with a glass of water. Follow the directions on the prescription label. Take your medicine at regular intervals. Do not take it more often than directed. Do not stop taking except on your doctor's advice. Stopping this medicine may increase your risk of a blot clot. Be sure to refill your prescription before you run out of medicine. If you are taking this medicine after hip or knee  replacement surgery, take it with or without food. If you are taking this medicine for atrial fibrillation, take it with your evening meal. If you are taking this medicine to treat blood clots, take it with food at the same time each day. If you are unable to swallow your tablet, you may crush the tablet and mix it in applesauce. Then, immediately eat the applesauce. You should eat more food right after you eat the applesauce containing the crushed tablet. Talk to your pediatrician regarding the use of this medicine in children. Special care may be needed. Overdosage: If you think you have taken too much of this medicine contact a poison control center or emergency room at once. NOTE: This medicine is only for you. Do not share this medicine with others. What if I miss a dose? If you take your medicine once a day and miss a dose, take the missed dose as soon as you remember. If you take your medicine twice a day and miss a dose, take the missed dose immediately. In this instance, 2 tablets may be taken at the same time. The next day you should take 1 tablet twice a day as directed. What may interact with this medicine? -aspirin and aspirin-like medicines -certain antibiotics like erythromycin, azithromycin, and clarithromycin -certain medicines for fungal infections like ketoconazole and itraconazole -certain medicines for irregular heart beat like amiodarone, quinidine, dronedarone -certain medicines for seizures like carbamazepine, phenytoin -certain medicines that treat or prevent blood clots like warfarin, enoxaparin, and dalteparin -conivaptan -diltiazem -felodipine -  indinavir -lopinavir; ritonavir -NSAIDS, medicines for pain and inflammation, like ibuprofen or naproxen -ranolazine -rifampin -ritonavir -St. John's wort -verapamil This list may not describe all possible interactions. Give your health care provider a list of all the medicines, herbs, non-prescription drugs, or dietary  supplements you use. Also tell them if you smoke, drink alcohol, or use illegal drugs. Some items may interact with your medicine. What should I watch for while using this medicine? Visit your doctor or health care professional for regular checks on your progress. Your condition will be monitored carefully while you are receiving this medicine. Notify your doctor or health care professional and seek emergency treatment if you develop breathing problems; changes in vision; chest pain; severe, sudden headache; pain, swelling, warmth in the leg; trouble speaking; sudden numbness or weakness of the face, arm, or leg. These can be signs that your condition has gotten worse. If you are going to have surgery, tell your doctor or health care professional that you are taking this medicine. Tell your health care professional that you use this medicine before you have a spinal or epidural procedure. Sometimes people who take this medicine have bleeding problems around the spine when they have a spinal or epidural procedure. This bleeding is very rare. If you have a spinal or epidural procedure while on this medicine, call your health care professional immediately if you have back pain, numbness or tingling (especially in your legs and feet), muscle weakness, paralysis, or loss of bladder or bowel control. Avoid sports and activities that might cause injury while you are using this medicine. Severe falls or injuries can cause unseen bleeding. Be careful when using sharp tools or knives. Consider using an Copy. Take special care brushing or flossing your teeth. Report any injuries, bruising, or red spots on the skin to your doctor or health care professional. What side effects may I notice from receiving this medicine? Side effects that you should report to your doctor or health care professional as soon as possible: -allergic reactions like skin rash, itching or hives, swelling of the face, lips, or  tongue -back pain -redness, blistering, peeling or loosening of the skin, including inside the mouth -signs and symptoms of bleeding such as bloody or black, tarry stools; red or dark-brown urine; spitting up blood or brown material that looks like coffee grounds; red spots on the skin; unusual bruising or bleeding from the eye, gums, or nose Side effects that usually do not require medical attention (Report these to your doctor or health care professional if they continue or are bothersome.): -dizziness -muscle pain This list may not describe all possible side effects. Call your doctor for medical advice about side effects. You may report side effects to FDA at 1-800-FDA-1088. Where should I keep my medicine? Keep out of the reach of children. Store at room temperature between 15 and 30 degrees C (59 and 86 degrees F). Throw away any unused medicine after the expiration date. NOTE: This sheet is a summary. It may not cover all possible information. If you have questions about this medicine, talk to your doctor, pharmacist, or health care provider.  2015, Elsevier/Gold Standard. (2013-06-12 18:47:48)

## 2014-08-19 NOTE — Progress Notes (Signed)
Patient ID: Anna Butler, female   DOB: 24-Sep-1995, 19 y.o.   MRN: [id number]    Annual Screening Comprehensive Examination   This very nice 19 y.o.female presents for complete physical.  Patient has no major health issues.  Patient reports no complaints at this time.   Finally, patient has history of Vitamin D Deficiency and last vitamin D was  Lab Results  Component Value Date   VD25OH 15* 06/23/2013  .  Currently on supplementation  Patient reports that she has been having some constipation lately.  She reports having 3 bowel movements per week.  She does take fiber which sometimes helps.   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  She is interested in possibly going off coumadin and onto xarelto or eliquis.   She is concerned about taking xanax as it makes her really sleepy.    Current Outpatient Prescriptions on File Prior to Visit  Medication Sig Dispense Refill  . ALPRAZolam (XANAX) 0.5 MG tablet Take 1 tablet (0.5 mg total) by mouth 2 (two) times daily as needed for anxiety. 60 tablet 0  . warfarin (COUMADIN) 5 MG tablet Take 2 tablets daily 300 tablet 0   No current facility-administered medications on file prior to visit.    No Known Allergies  Past Medical History  Diagnosis Date  . Sickle cell trait   . Dyspnea Oct 2011  . Renal failure Oct 2011  . DVT (deep venous thrombosis) Oct 2011  . Clotting disorder   . Anemia   . Scoliosis   . Lupus anticoagulant with hypercoagulable state 2011  . Migraines   . ADD (attention deficit disorder)   . Depression     Immunization History  Administered Date(s) Administered  . Tdap 03/06/2010    Past Surgical History  Procedure Laterality Date  . Incise and drain abcess      buttock abscess    Family History  Problem Relation Age of Onset  . Obesity Mother   . Hypertension Mother   . Cancer Maternal Grandfather     lung  . Heart disease Maternal Grandfather      History   Social History  . Marital Status: Single    Spouse Name: N/A  . Number of Children: N/A  . Years of Education: N/A   Occupational History  . Not on file.   Social History Main Topics  . Smoking status: Never Smoker   . Smokeless tobacco: Not on file  . Alcohol Use: No  . Drug Use: No  . Sexual Activity: Not on file   Other Topics Concern  . Not on file   Social History Narrative   Lives with Anna Butler here in [place]   Dad lives out of state   Likes drama class best at [employer] 10th grade   Good student only missed 2 days in 2010   Review of Systems  Constitutional: Positive for malaise/fatigue. Negative for fever, chills and weight loss.  HENT: Negative for congestion, ear discharge, nosebleeds and sore throat.   Eyes: Negative.   Respiratory: Negative for cough, shortness of breath and wheezing.   Cardiovascular: Negative for chest pain, palpitations and leg swelling.  Gastrointestinal: Positive for constipation. Negative for heartburn, nausea, vomiting, diarrhea, blood in stool and melena.  Genitourinary: Negative.   Musculoskeletal: Negative.   Skin: Negative.   Neurological: Negative for dizziness, sensory change and headaches.  Psychiatric/Behavioral: Negative for depression. The patient is nervous/anxious and  has insomnia.       Physical Exam  BP 116/64 mmHg  Pulse 74  Temp(Src) 98.4 F (36.9 C) (Temporal)  Resp 18  Ht 5' 4.75" (1.645 m)  Wt 186 lb (84.369 kg)  BMI 31.18 kg/m2  LMP 08/19/2014  General Appearance: Well nourished and in no apparent distress. Eyes: PERRLA, EOMs, conjunctiva no swelling or erythema, normal fundi and vessels. Sinuses: No frontal/maxillary tenderness ENT/Mouth: EACs patent / TMs  nl. Nares clear without erythema, swelling, mucoid exudates. Oral hygiene is good. No erythema, swelling, or exudate. Tongue normal, non-obstructing. Tonsils not swollen or erythematous. Hearing normal.   Neck: Supple, thyroid normal. No bruits, nodes or JVD. Respiratory: Respiratory effort normal.  BS equal and clear bilateral without rales, rhonci, wheezing or stridor. Cardio: Heart sounds are normal with regular rate and rhythm and no murmurs, rubs or gallops. Peripheral pulses are normal and equal bilaterally without edema. No aortic or femoral bruits. Chest: symmetric with normal excursions and percussion. Abdomen: Flat, soft, with bowl sounds. Nontender, no guarding, rebound, hernias, masses, or organomegaly.  Lymphatics: Non tender without lymphadenopathy.  Musculoskeletal: Full ROM all peripheral extremities, joint stability, 5/5 strength, and normal gait. Skin: Warm and dry without rashes, lesions, cyanosis, clubbing or  ecchymosis.  Neuro: Cranial nerves intact, reflexes equal bilaterally. Normal muscle tone, no cerebellar symptoms. Sensation intact.  Pysch: Awake and oriented X 3, normal affect, Insight and Judgment appropriate.   Assessment and Plan    1. 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 Long term current use of anticoagulant therapy -cont coumadin - Protime-INR  3. Anticoagulant long-term use -cont coumadin  4. Screening for diabetes mellitus  - Hemoglobin A1c - Insulin, random  5. Screening for hyperlipidemia  - Lipid panel  6. Medication management  - CBC with Differential/Platelet - BASIC METABOLIC PANEL WITH GFR - Hepatic function panel - Magnesium  7. Screening for hematuria or proteinuria  - Urinalysis, Routine w reflex microscopic (not at [facility]) - Microalbumin / creatinine urine ratio  8. Other fatigue  - Iron and TIBC - Vitamin B12 - TSH  9. Vitamin D deficiency -start supplement - Vit D  25 hydroxy (rtn osteoporosis monitoring)  10.  Anxiety -start zoloft -use xanax as rescue medication -recheck in 6 weeks.   Continue prudent diet as discussed, weight control, regular exercise, and medications. Routine screening labs and tests as requested with regular follow-up as recommended.  Over 40 minutes of exam, counseling, chart review and critical decision making was performed

## 2014-08-20 LAB — URINALYSIS, MICROSCOPIC ONLY
Bacteria, UA: NONE SEEN
Casts: NONE SEEN
Crystals: NONE SEEN
SQUAMOUS EPITHELIAL / LPF: NONE SEEN

## 2014-08-20 LAB — URINALYSIS, ROUTINE W REFLEX MICROSCOPIC
Bilirubin Urine: NEGATIVE
Glucose, UA: NEGATIVE mg/dL
Ketones, ur: NEGATIVE mg/dL
Leukocytes, UA: NEGATIVE
NITRITE: NEGATIVE
Protein, ur: NEGATIVE mg/dL
Specific Gravity, Urine: <1.005 — ABNORMAL LOW (ref 1.005–1.030)
Urobilinogen, UA: 0.2 mg/dL (ref 0.0–1.0)
pH: 5 (ref 5.0–8.0)

## 2014-08-20 LAB — PROTIME-INR
INR: 1.47 (ref <1.50)
Prothrombin Time: 17.8 seconds — ABNORMAL HIGH (ref 11.6–15.2)

## 2014-08-20 LAB — MICROALBUMIN / CREATININE URINE RATIO
Creatinine, Urine: 54.8 mg/dL
Microalb Creat Ratio: 5.5 mg/g (ref 0.0–30.0)
Microalb, Ur: 0.3 mg/dL (ref <2.0)

## 2014-08-20 LAB — INSULIN, RANDOM: INSULIN: 8.9 u[IU]/mL (ref 2.0–19.6)

## 2014-08-20 LAB — VITAMIN D 25 HYDROXY (VIT D DEFICIENCY, FRACTURES): VIT D 25 HYDROXY: 12 ng/mL — AB (ref 30–100)

## 2014-08-20 LAB — TSH: TSH: 1.253 u[IU]/mL (ref 0.350–4.500)

## 2014-08-20 LAB — VITAMIN B12: Vitamin B-12: 410 pg/mL (ref 211–911)

## 2014-08-20 LAB — HEMOGLOBIN A1C
HEMOGLOBIN A1C: 5.2 % (ref <5.7)
Mean Plasma Glucose: 103 mg/dL (ref <117)

## 2014-08-24 ENCOUNTER — Other Ambulatory Visit: Payer: Self-pay | Admitting: Internal Medicine

## 2014-08-24 MED ORDER — RIVAROXABAN 20 MG PO TABS: 20.0000 mg | ORAL_TABLET | Freq: Every day | ORAL | Status: DC

## 2014-09-30 ENCOUNTER — Other Ambulatory Visit: Payer: Self-pay

## 2014-10-05 ENCOUNTER — Ambulatory Visit (INDEPENDENT_AMBULATORY_CARE_PROVIDER_SITE_OTHER): Payer: BLUE CROSS/BLUE SHIELD | Admitting: Internal Medicine

## 2014-10-05 VITALS — BP 108/64 | HR 76 | Temp 98.0°F | Resp 18 | Ht 64.75 in | Wt 188.0 lb

## 2014-10-05 DIAGNOSIS — Z7901 Long term (current) use of anticoagulants: Secondary | ICD-10-CM | POA: Diagnosis not present

## 2014-10-05 DIAGNOSIS — I82409 Acute embolism and thrombosis of unspecified deep veins of unspecified lower extremity: Secondary | ICD-10-CM

## 2014-10-05 DIAGNOSIS — F411 Generalized anxiety disorder: Secondary | ICD-10-CM | POA: Diagnosis not present

## 2014-10-05 MED ORDER — WARFARIN SODIUM 5 MG PO TABS: 5.0000 mg | ORAL_TABLET | Freq: Every day | ORAL | Status: DC

## 2014-10-05 MED ORDER — SERTRALINE HCL 100 MG PO TABS: 100.0000 mg | ORAL_TABLET | Freq: Every day | ORAL | Status: DC

## 2014-10-05 MED ORDER — ALPRAZOLAM 0.5 MG PO TABS: 0.5000 mg | ORAL_TABLET | Freq: Two times a day (BID) | ORAL | Status: DC | PRN

## 2014-10-05 NOTE — Progress Notes (Signed)
Patient ID: Anna Butler, female   DOB: 01/08/1996, 19 y.o.   MRN: [id number]  Assessment and Plan:   1. Acute thromboembolism of deep veins of lower extremity, unspecified laterality D/c xarelto -restart coumadin -PT/INR  2. Long term current use of anticoagulant therapy -cont coumadin - Protime-INR  3. Generalized anxiety disorder -increase zoloft to 100 mg  -cont xanax     HPI 19 y.o.female presents for 1 month follow up of blood clots on xarelto and also for anxiety. She did take the xarelto for about a week and then went back to coumadin.  She has been taking 10 mg T, Th, S, Sun and then 7.5 mg rest of the week.  She last had PT/INR checked 2 months ago.   She reports that she is taking xanax just to help with sleeping a couple nights a week.  She was recently started on zoloft as well and she cannot tell the difference with the zoloft.  She reports that she does still have panic attacks once to twice weekly.  She reports no side effects from the zoloft.   Patient reports that they have been doing well.  female is taking their medication.  They are not having difficulty with their medications.  They report  No adverse reactions.  No blood in stools, no blood in urine, no nosebleeds,  No falls, haven't hit head.    Past Medical History  Diagnosis Date  . Sickle cell trait   . Dyspnea Oct 2011  . Renal failure Oct 2011  . DVT (deep venous thrombosis) Oct 2011  . Clotting disorder   . Anemia   . Scoliosis   . Lupus anticoagulant with hypercoagulable state 2011  . Migraines   . ADD (attention deficit disorder)   . Depression      No Known Allergies    Current Outpatient Prescriptions on File Prior to Visit  Medication Sig Dispense Refill  . ALPRAZolam (XANAX) 0.5 MG tablet Take 1 tablet (0.5 mg total) by mouth 2 (two) times daily as needed for anxiety. 60 tablet 0  . sertraline (ZOLOFT) 50 MG tablet For the first week take 1/2 tablet daily.  After first week if no side  effects please increase to one tablet daily. 30 tablet 2   No current facility-administered medications on file prior to visit.    ROS: all negative except above.   Physical Exam: Filed Weights   10/05/14 1050  Weight: 188 lb (85.276 kg)   BP 108/64 mmHg  Pulse 76  Temp(Src) 98 F (36.7 C) (Temporal)  Resp 18  Ht 5' 4.75" (1.645 m)  Wt 188 lb (85.276 kg)  BMI 31.51 kg/m2 General Appearance: Well developed well nourished, non-toxic appearing in no apparent distress. Eyes: PERRLA, EOMs, conjunctiva w/ no swelling or erythema or discharge Sinuses: No Frontal/maxillary tenderness ENT/Mouth: Ear canals clear without swelling or erythema.  TM's normal bilaterally with no retractions, bulging, or loss of landmarks.   Neck: Supple, thyroid normal, no notable JVD  Respiratory: Respiratory effort normal, Clear breath sounds anteriorly and posteriorly bilaterally without rales, rhonchi, wheezing or stridor. No retractions or accessory muscle usage. Cardio: RRR with no MRGs.   Abdomen: Soft, + BS.  Non tender, no guarding, rebound, hernias, masses.  Musculoskeletal: Full ROM, 5/5 strength, normal gait.  Skin: Warm, dry without rashes  Neuro: Awake and oriented X 3, Cranial nerves intact. Normal muscle tone, no cerebellar symptoms. Sensation intact.  Psych: normal affect, Insight and Judgment appropriate.  Terri Piedra, PA-C 10:59 AM [facility]

## 2014-10-06 LAB — PROTIME-INR
INR: 2.3 — AB (ref <1.50)
PROTHROMBIN TIME: 25.3 s — AB (ref 11.6–15.2)

## 2014-10-21 DIAGNOSIS — D6861 Antiphospholipid syndrome: Secondary | ICD-10-CM | POA: Insufficient documentation

## 2014-12-08 ENCOUNTER — Ambulatory Visit: Payer: Self-pay | Admitting: Physician Assistant

## 2014-12-14 ENCOUNTER — Ambulatory Visit (INDEPENDENT_AMBULATORY_CARE_PROVIDER_SITE_OTHER): Payer: BLUE CROSS/BLUE SHIELD | Admitting: Internal Medicine

## 2014-12-14 ENCOUNTER — Encounter: Payer: Self-pay | Admitting: Internal Medicine

## 2014-12-14 VITALS — BP 116/64 | HR 86 | Temp 98.0°F | Resp 18 | Ht 64.75 in | Wt 204.0 lb

## 2014-12-14 DIAGNOSIS — Z7901 Long term (current) use of anticoagulants: Secondary | ICD-10-CM | POA: Diagnosis not present

## 2014-12-14 DIAGNOSIS — J209 Acute bronchitis, unspecified: Secondary | ICD-10-CM | POA: Diagnosis not present

## 2014-12-14 DIAGNOSIS — Z79899 Other long term (current) drug therapy: Secondary | ICD-10-CM | POA: Diagnosis not present

## 2014-12-14 MED ORDER — PREDNISONE 20 MG PO TABS: ORAL_TABLET | ORAL | Status: DC

## 2014-12-14 MED ORDER — ALBUTEROL SULFATE HFA 108 (90 BASE) MCG/ACT IN AERS: 2.0000 | INHALATION_SPRAY | RESPIRATORY_TRACT | Status: DC | PRN

## 2014-12-14 NOTE — Progress Notes (Signed)
Patient ID: Anna Butler, female   DOB: May 25, 1995, 19 y.o.   MRN: [id number]  Assessment and Plan:   1. Acute bronchitis, unspecified organism -likely need to cut prednisone in half due to abx usage -think that this is likely why there bruising due to a platelet reaction - predniSONE (DELTASONE) 20 MG tablet; 3 tabs po day one, then 2 tabs daily x 4 days  Dispense: 11 tablet; Refill: 0 - albuterol (PROVENTIL HFA;VENTOLIN HFA) 108 (90 BASE) MCG/ACT inhaler; Inhale 2 puffs into the lungs every 2 (two) hours as needed for wheezing or shortness of breath (cough).  Dispense: 1 Inhaler; Refill: 0  2. Medication management  - Hepatic function panel - CBC with Differential/Platelet  3. Long term current use of anticoagulant therapy  - Protime-INR     HPI 19 y.o.female presents for concerns over bruising.  She is currently on coumadin therapy for DVT.  She is currently having her coumadin monitored at school.  She reports that they checked it last week and it was 1.7.  She is taking 10 mg daily. She did try holding her coumadin for 2 days during the bruising.  She reports that the bruising was limited to her arms.  She denies taking any OTC meds. Patient reports that they have been doing well.  female is taking their medication.  They are not having difficulty with their medications.  They report no adverse reactions.  She reports that she has not had issues with bruising in the past.  She also reports that she has had a slowing in the bruising and they do appear to be healing.     She is currently taking amoxicillin secondary to having bronchitis.  She notes that she started having the bruising when she got sick.  She reports that the fevers and sick feeling have gone away but she is still wheezing, coughing, and having runny nose.    Past Medical History  Diagnosis Date  . Sickle cell trait (HCC)   . Dyspnea Oct 2011  . Renal failure Oct 2011  . DVT (deep venous thrombosis) [facility]) Oct  2011  . Clotting disorder (HCC)   . Anemia   . Scoliosis   . Lupus anticoagulant with hypercoagulable state (HCC) 2011  . Migraines   . ADD (attention deficit disorder)   . Depression      No Known Allergies    Current Outpatient Prescriptions on File Prior to Visit  Medication Sig Dispense Refill  . ALPRAZolam (XANAX) 0.5 MG tablet Take 1 tablet (0.5 mg total) by mouth 2 (two) times daily as needed for anxiety. 60 tablet 0  . sertraline (ZOLOFT) 100 MG tablet Take 1 tablet (100 mg total) by mouth daily. 30 tablet 2  . warfarin (COUMADIN) 5 MG tablet Take 1 tablet (5 mg total) by mouth daily. 90 tablet 1   No current facility-administered medications on file prior to visit.    ROS: all negative except above.   Physical Exam: Filed Weights   12/14/14 1117  Weight: 204 lb (92.534 kg)   BP 116/64 mmHg  Pulse 86  Temp(Src) 98 F (36.7 C) (Temporal)  Resp 18  Ht 5' 4.75" (1.645 m)  Wt 204 lb (92.534 kg)  BMI 34.20 kg/m2  LMP 11/23/2014 General Appearance: Well developed well nourished, non-toxic appearing in no apparent distress. Eyes: PERRLA, EOMs, conjunctiva w/ no swelling or erythema or discharge Sinuses: No Frontal/maxillary tenderness ENT/Mouth: Ear canals clear without swelling or erythema.  TM's normal bilaterally  with no retractions, bulging, or loss of landmarks.   Neck: Supple, thyroid normal, no notable JVD  Respiratory: Respiratory effort normal, Clear breath sounds anteriorly and posteriorly bilaterally without rales, There is rhonchi which clears with coughing and wheeze with exhalation in bilateral bases. No retractions or accessory muscle usage. Cardio: RRR with no MRGs.   Abdomen: Soft, + BS.  Non tender, no guarding, rebound, hernias, masses.  Musculoskeletal: Full ROM, 5/5 strength, normal gait.  Skin: Warm, dry without rashes No appreciable bruising to arms Neuro: Awake and oriented X 3, Cranial nerves intact. Normal muscle tone, no cerebellar symptoms.  Sensation intact.  Psych: normal affect, Insight and Judgment appropriate.     Terri Piedra, PA-C 11:39 AM [facility]

## 2014-12-14 NOTE — Patient Instructions (Signed)

## 2014-12-15 LAB — PROTIME-INR
INR: 1.72 — ABNORMAL HIGH (ref <1.50)
Prothrombin Time: 20.4 seconds — ABNORMAL HIGH (ref 11.6–15.2)

## 2014-12-15 LAB — CBC WITH DIFFERENTIAL/PLATELET
Basophils Absolute: 0 10*3/uL (ref 0.0–0.1)
Basophils Relative: 1 % (ref 0–1)
Eosinophils Absolute: 0.1 10*3/uL (ref 0.0–0.7)
Eosinophils Relative: 2 % (ref 0–5)
HEMATOCRIT: 39.1 % (ref 36.0–46.0)
Hemoglobin: 12.9 g/dL (ref 12.0–15.0)
LYMPHS PCT: 35 % (ref 12–46)
Lymphs Abs: 1.4 10*3/uL (ref 0.7–4.0)
MCH: 27.2 pg (ref 26.0–34.0)
MCHC: 33 g/dL (ref 30.0–36.0)
MCV: 82.5 fL (ref 78.0–100.0)
MONO ABS: 0.5 10*3/uL (ref 0.1–1.0)
MPV: 9.1 fL (ref 8.6–12.4)
Monocytes Relative: 12 % (ref 3–12)
Neutro Abs: 2 10*3/uL (ref 1.7–7.7)
Neutrophils Relative %: 50 % (ref 43–77)
PLATELETS: 351 10*3/uL (ref 150–400)
RBC: 4.74 MIL/uL (ref 3.87–5.11)
RDW: 16.6 % — AB (ref 11.5–15.5)
WBC: 3.9 10*3/uL — AB (ref 4.0–10.5)

## 2014-12-15 LAB — HEPATIC FUNCTION PANEL
ALT: 35 U/L — ABNORMAL HIGH (ref 6–29)
AST: 23 U/L (ref 10–30)
Albumin: 4.3 g/dL (ref 3.6–5.1)
Alkaline Phosphatase: 67 U/L (ref 33–115)
BILIRUBIN DIRECT: 0.1 mg/dL (ref <=0.2)
Indirect Bilirubin: 0.2 mg/dL (ref 0.2–1.2)
Total Bilirubin: 0.3 mg/dL (ref 0.2–1.2)
Total Protein: 6.9 g/dL (ref 6.1–8.1)

## 2014-12-22 ENCOUNTER — Emergency Department (HOSPITAL_COMMUNITY)
Admission: EM | Admit: 2014-12-22 | Discharge: 2014-12-22 | Disposition: A | Discharge status: Home / Self Care | Payer: BLUE CROSS/BLUE SHIELD | Attending: Emergency Medicine | Admitting: Emergency Medicine

## 2014-12-22 ENCOUNTER — Encounter (HOSPITAL_COMMUNITY): Payer: Self-pay

## 2014-12-22 ENCOUNTER — Emergency Department (HOSPITAL_COMMUNITY): Payer: BLUE CROSS/BLUE SHIELD

## 2014-12-22 DIAGNOSIS — Y998 Other external cause status: Secondary | ICD-10-CM | POA: Diagnosis not present

## 2014-12-22 DIAGNOSIS — Z79899 Other long term (current) drug therapy: Secondary | ICD-10-CM | POA: Diagnosis not present

## 2014-12-22 DIAGNOSIS — Y9389 Activity, other specified: Secondary | ICD-10-CM | POA: Insufficient documentation

## 2014-12-22 DIAGNOSIS — Z86718 Personal history of other venous thrombosis and embolism: Secondary | ICD-10-CM | POA: Insufficient documentation

## 2014-12-22 DIAGNOSIS — X58XXXA Exposure to other specified factors, initial encounter: Secondary | ICD-10-CM | POA: Insufficient documentation

## 2014-12-22 DIAGNOSIS — Z862 Personal history of diseases of the blood and blood-forming organs and certain disorders involving the immune mechanism: Secondary | ICD-10-CM | POA: Insufficient documentation

## 2014-12-22 DIAGNOSIS — Y9289 Other specified places as the place of occurrence of the external cause: Secondary | ICD-10-CM | POA: Insufficient documentation

## 2014-12-22 DIAGNOSIS — G43909 Migraine, unspecified, not intractable, without status migrainosus: Secondary | ICD-10-CM | POA: Diagnosis not present

## 2014-12-22 DIAGNOSIS — S93401A Sprain of unspecified ligament of right ankle, initial encounter: Secondary | ICD-10-CM | POA: Diagnosis not present

## 2014-12-22 DIAGNOSIS — S99911A Unspecified injury of right ankle, initial encounter: Secondary | ICD-10-CM | POA: Diagnosis present

## 2014-12-22 DIAGNOSIS — F329 Major depressive disorder, single episode, unspecified: Secondary | ICD-10-CM | POA: Insufficient documentation

## 2014-12-22 NOTE — ED Notes (Signed)
Pt reports coming down slide and hitting ankles hard. Pt now has swelling and pain to right ankle. Pulses/sensation intact. NAD.

## 2014-12-22 NOTE — Discharge Instructions (Signed)
Ankle Sprain  An ankle sprain is an injury to the strong, fibrous tissues (ligaments) that hold the bones of your ankle joint together.   CAUSES  An ankle sprain is usually caused by a fall or by twisting your ankle. Ankle sprains most commonly occur when you step on the outer edge of your foot, and your ankle turns inward. People who participate in sports are more prone to these types of injuries.   SYMPTOMS    Pain in your ankle. The pain may be present at rest or only when you are trying to stand or walk.   Swelling.   Bruising. Bruising may develop immediately or within 1 to 2 days after your injury.   Difficulty standing or walking, particularly when turning corners or changing directions.  DIAGNOSIS   Your caregiver will ask you details about your injury and perform a physical exam of your ankle to determine if you have an ankle sprain. During the physical exam, your caregiver will press on and apply pressure to specific areas of your foot and ankle. Your caregiver will try to move your ankle in certain ways. An X-ray exam may be done to be sure a bone was not broken or a ligament did not separate from one of the bones in your ankle (avulsion fracture).   TREATMENT   Certain types of braces can help stabilize your ankle. Your caregiver can make a recommendation for this. Your caregiver may recommend the use of medicine for pain. If your sprain is severe, your caregiver may refer you to a surgeon who helps to restore function to parts of your skeletal system (orthopedist) or a physical therapist.  HOME CARE INSTRUCTIONS    Apply ice to your injury for 1-2 days or as directed by your caregiver. Applying ice helps to reduce inflammation and pain.    Put ice in a plastic bag.    Place a towel between your skin and the bag.    Leave the ice on for 15-20 minutes at a time, every 2 hours while you are awake.   Only take over-the-counter or prescription medicines for pain, discomfort, or fever as directed by  your caregiver.   Elevate your injured ankle above the level of your heart as much as possible for 2-3 days.   If your caregiver recommends crutches, use them as instructed. Gradually put weight on the affected ankle. Continue to use crutches or a cane until you can walk without feeling pain in your ankle.   If you have a plaster splint, wear the splint as directed by your caregiver. Do not rest it on anything harder than a pillow for the first 24 hours. Do not put weight on it. Do not get it wet. You may take it off to take a shower or bath.   You may have been given an elastic bandage to wear around your ankle to provide support. If the elastic bandage is too tight (you have numbness or tingling in your foot or your foot becomes cold and blue), adjust the bandage to make it comfortable.   If you have an air splint, you may blow more air into it or let air out to make it more comfortable. You may take your splint off at night and before taking a shower or bath. Wiggle your toes in the splint several times per day to decrease swelling.  SEEK MEDICAL CARE IF:    You have rapidly increasing bruising or swelling.   Your toes feel   extremely cold or you lose feeling in your foot.   Your pain is not relieved with medicine.  SEEK IMMEDIATE MEDICAL CARE IF:   Your toes are numb or blue.   You have severe pain that is increasing.  MAKE SURE YOU:    Understand these instructions.   Will watch your condition.   Will get help right away if you are not doing well or get worse.     This information is not intended to replace advice given to you by your health care provider. Make sure you discuss any questions you have with your health care provider.     Document Released: 02/20/2005 Document Revised: 03/13/2014 Document Reviewed: 03/04/2011  Elsevier Interactive Patient Education 2016 [employer]

## 2014-12-22 NOTE — ED Provider Notes (Signed)
CSN: [id number]     Arrival date & time 12/22/14  1831 History  By signing my name below, I, Freida Busman, attest that this documentation has been prepared under the direction and in the presence of non-physician practitioner, Arthor Captain, PA-C. Electronically Signed: Freida Busman, Scribe. 12/22/2014. 8:50 PM.   Chief Complaint  Patient presents with  . Ankle Pain   The history is provided by the patient. No language interpreter was used.   HPI Comments:  Anna Butler is a 19 y.o. female who presents to the Emergency Department complaining of gradual onset, moderate, swelling, and pain to the right foot following injury ~1300 today. Pt slid down a slide and her foot slammed into a ditch at the bottom. Pt has been able to ambulate on the foot since injury but notes increased pain. No alleviating factors noted.  Past Medical History  Diagnosis Date  . Sickle cell trait (HCC)   . Dyspnea Oct 2011  . Renal failure Oct 2011  . DVT (deep venous thrombosis) [facility]) Oct 2011  . Clotting disorder (HCC)   . Anemia   . Scoliosis   . Lupus anticoagulant with hypercoagulable state (HCC) 2011  . Migraines   . ADD (attention deficit disorder)   . Depression    Past Surgical History  Procedure Laterality Date  . Incise and drain abcess      buttock abscess   Family History  Problem Relation Age of Onset  . Obesity Mother   . Hypertension Mother   . Cancer Maternal Grandfather     lung  . Heart disease Maternal Grandfather    Social History  Substance Use Topics  . Smoking status: Never Smoker   . Smokeless tobacco: None  . Alcohol Use: No   OB History    No data available     Review of Systems  Constitutional: Negative for fever and chills.  Respiratory: Negative for shortness of breath.   Cardiovascular: Negative for chest pain.  Musculoskeletal: Positive for myalgias and joint swelling.       Right foot   Allergies  Review of patient's allergies indicates no known  allergies.  Home Medications   Prior to Admission medications   Medication Sig Start Date End Date Taking? Authorizing Provider  albuterol (PROVENTIL HFA;VENTOLIN HFA) 108 (90 BASE) MCG/ACT inhaler Inhale 2 puffs into the lungs every 2 (two) hours as needed for wheezing or shortness of breath (cough). 12/14/14   Courtney Forcucci, PA-C  ALPRAZolam (XANAX) 0.5 MG tablet Take 1 tablet (0.5 mg total) by mouth 2 (two) times daily as needed for anxiety. 10/05/14   Courtney Forcucci, PA-C  predniSONE (DELTASONE) 20 MG tablet 3 tabs po day one, then 2 tabs daily x 4 days 12/14/14   Toni Amend Forcucci, PA-C  sertraline (ZOLOFT) 100 MG tablet Take 1 tablet (100 mg total) by mouth daily. 10/05/14 10/05/15  Courtney Forcucci, PA-C  warfarin (COUMADIN) 5 MG tablet Take 1 tablet (5 mg total) by mouth daily. 10/05/14   Courtney Forcucci, PA-C   BP 130/63 mmHg  Pulse 87  Temp(Src) 98.1 F (36.7 C) (Oral)  Resp 16  Ht  (1.651 m)  Wt 195 lb (88.451 kg)  BMI 32.45 kg/m2  SpO2 99%  LMP 11/23/2014 Physical Exam  Constitutional: She is oriented to person, place, and time. She appears well-developed and well-nourished. No distress.  HENT:  Head: Normocephalic and atraumatic.  Eyes: Conjunctivae are normal.  Cardiovascular: Normal rate.   Pulmonary/Chest: Effort normal.  Abdominal:  She exhibits no distension.  Musculoskeletal:  Moderate swellling of lateral right malleolus  TTP over the malleolus and proximal lateral dorsum of the right foot  Palpable pulses and sensation Pt able to range foot but ROM limited secondary to pain   Neurological: She is alert and oriented to person, place, and time.  Skin: Skin is warm and dry.  Psychiatric: She has a normal mood and affect.  Nursing note and vitals reviewed.   ED Course  Procedures   DIAGNOSTIC STUDIES:  Oxygen Saturation is 99% on RA, normal by my interpretation.    COORDINATION OF CARE:  8:43 PM Will discharge with crutches. Advised pt to use  ibuprofen for pain, apply ice and to keep the extremity elevated. Discussed treatment plan with pt at bedside and pt agreed to plan.  Labs Review Labs Reviewed - No data to display  Imaging Review Dg Ankle Complete Right  12/22/2014  CLINICAL DATA:  Right ankle pain after injury. Fell off slide today. EXAM: RIGHT ANKLE - COMPLETE 3+ VIEW COMPARISON:  None. FINDINGS: No fracture or dislocation. The alignment and joint spaces are maintained. The ankle mortise is preserved. There is mild lateral soft tissue edema. IMPRESSION: Mild soft tissue edema, no fracture or subluxation. Electronically Signed   By: Rubye OaksMelanie  Ehinger M.D.   On: 12/22/2014 19:26   I have personally reviewed and evaluated these images as part of my medical decision-making.   EKG Interpretation None      MDM   Final diagnoses:  Ankle sprain, right, initial encounter     Patient X-Ray negative for obvious fracture or dislocation. Pain managed in ED.   Home Care: Rest and elevate the injured ankle, apply ice intermittently. Use crutches without weight bearing until able to comfortable bear partial weight, then progress to full weight bearing as tolerated. Splint applied. See ortho prn.  Patient will be dc home & is agreeable with above plan.  Lucila MaineI, Bud Kaeser, personally performed the services described in this documentation. All medical record entries made by the scribe were at my direction and in my presence.  I have reviewed the chart and discharge instructions and agree that the record reflects my personal performance and is accurate and complete. Arthor CaptainHarris, Sim Choquette.  12/22/2014. 11:00 PM.       Arthor CaptainAbigail Annsley Akkerman, PA-C 12/22/14 2302  Donnetta HutchingBrian Cook, MD 12/23/14 0010

## 2014-12-22 NOTE — ED Notes (Signed)
Pt states she was on a field trip with mother's children. Pt was asked to go down a slide with a child. Pt states she slid down the slide and came out "funny" impacting with both feet in an awkward position. Pt denies right ankle rolling upon impact. Pt states that the stop was very abrupt. Pt states impact happened at approximately 1300.

## 2014-12-23 ENCOUNTER — Telehealth: Payer: Self-pay

## 2014-12-23 MED ORDER — ALPRAZOLAM 0.5 MG PO TABS: 0.5000 mg | ORAL_TABLET | Freq: Two times a day (BID) | ORAL | Status: DC | PRN

## 2014-12-23 NOTE — Telephone Encounter (Signed)
Pt requesting Rx refill. Alprazolam 0.5mg  1 tab BID prn. [facility] [address]. Please advise.

## 2014-12-31 ENCOUNTER — Ambulatory Visit: Payer: Self-pay | Admitting: Physician Assistant

## 2015-02-21 ENCOUNTER — Other Ambulatory Visit: Payer: Self-pay | Admitting: Internal Medicine

## 2015-02-21 DIAGNOSIS — D6862 Lupus anticoagulant syndrome: Secondary | ICD-10-CM

## 2015-02-23 ENCOUNTER — Other Ambulatory Visit: Payer: Self-pay | Admitting: *Deleted

## 2015-02-23 DIAGNOSIS — D6862 Lupus anticoagulant syndrome: Secondary | ICD-10-CM

## 2015-02-23 MED ORDER — WARFARIN SODIUM 5 MG PO TABS: ORAL_TABLET | ORAL | Status: DC

## 2015-03-09 ENCOUNTER — Ambulatory Visit (INDEPENDENT_AMBULATORY_CARE_PROVIDER_SITE_OTHER): Payer: BLUE CROSS/BLUE SHIELD | Admitting: Internal Medicine

## 2015-03-09 ENCOUNTER — Encounter: Payer: Self-pay | Admitting: Internal Medicine

## 2015-03-09 VITALS — BP 116/64 | HR 72 | Temp 98.0°F | Resp 16 | Ht 64.75 in | Wt 214.0 lb

## 2015-03-09 DIAGNOSIS — D6862 Lupus anticoagulant syndrome: Secondary | ICD-10-CM

## 2015-03-09 MED ORDER — WARFARIN SODIUM 5 MG PO TABS: ORAL_TABLET | ORAL | Status: DC

## 2015-03-09 NOTE — Progress Notes (Signed)
Patient ID: ANALENA GAMA, female   DOB: [birth year], 20 y.o.   MRN: [id number]  Assessment and Plan:   1. Lupus anticoagulant with hypercoagulable state (HCC)  - Protime-INR - warfarin (COUMADIN) 5 MG tablet; Take 1 tablet daily or as directed - need office visit before further refills  Dispense: 120 tablet; Refill: 2     Continue diet and meds as discussed. Further disposition pending results of labs.  HPI 20 y.o. 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  She reports that she has been doing well.  She reports that she has had her INR checked at [facility] and hasn't had it done since early December.  She reports that the coumadin has been in a good range.  She hasn't fallen, hasn't hit head.    She has not been on abx recently. Not since bronchitis.    No other problems.  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  They will be managing her coumadin there for her.  She doesn't know whether they will be prescribing the coumadin there.      Anxiety is doing well.  She still takes zoloft.  Takes xanax only as needed.  Still having some mild issues with sleeping and staying asleep.    Current Medications:  Current Outpatient Prescriptions on File Prior to Visit  Medication Sig Dispense Refill  . ALPRAZolam (XANAX) 0.5 MG tablet Take 1 tablet (0.5 mg total) by mouth 2 (two) times daily as needed for anxiety. 60 tablet 0  . sertraline (ZOLOFT) 100 MG tablet Take 1 tablet (100 mg total) by mouth daily. 30 tablet 2  . warfarin (COUMADIN) 5 MG tablet Take 1 tablet daily or as directed - need office visit before further refills 30 tablet 0   No current facility-administered medications on file prior to visit.    Medical History:  Past Medical History  Diagnosis Date  . Sickle cell trait (HCC)   . Dyspnea Oct 2011  . Renal failure Oct 2011  . DVT (deep venous thrombosis) [facility]) Oct  2011  . Clotting disorder (HCC)   . Anemia   . Scoliosis   . Lupus anticoagulant with hypercoagulable state (HCC) 2011  . Migraines   . ADD (attention deficit disorder)   . Depression     Allergies: No Known Allergies   Review of Systems:  Review of Systems  Constitutional: Negative for fever, chills and malaise/fatigue.  HENT: Negative for congestion, ear pain and sore throat.   Eyes: Negative.   Respiratory: Negative for cough, shortness of breath and wheezing.   Cardiovascular: Negative for chest pain, palpitations and leg swelling.  Gastrointestinal: Negative for heartburn, diarrhea, constipation, blood in stool and melena.  Genitourinary: Negative for dysuria, urgency, frequency and hematuria.  Musculoskeletal: Negative.   Neurological: Negative for dizziness, sensory change, loss of consciousness and headaches.  Psychiatric/Behavioral: Negative for depression. The patient is not nervous/anxious and does not have insomnia.     Family history- Review and unchanged  Social history- Review and unchanged  Physical Exam: BP 116/64 mmHg  Pulse 72  Temp(Src) 98 F (36.7 C) (Temporal)  Resp 16  Ht 5' 4.75" (1.645 m)  Wt 214 lb (97.07 kg)  BMI 35.87 kg/m2  LMP 03/09/2015 Wt Readings from Last 3 Encounters:  03/09/15 214 lb (97.07 kg)  12/22/14 195 lb (88.451 kg)  12/14/14 204  lb (92.534 kg)    General Appearance: Well nourished well developed, in no apparent distress. Eyes: PERRLA, EOMs, conjunctiva no swelling or erythema ENT/Mouth: Ear canals normal without obstruction, swelling, erythma, discharge.  TMs normal bilaterally.  Oropharynx moist, clear, without exudate, or postoropharyngeal swelling. Neck: Supple, thyroid normal,no cervical adenopathy  Respiratory: Respiratory effort normal, Breath sounds clear A&P without rhonchi, wheeze, or rale.  No retractions, no accessory usage. Cardio: RRR with no MRGs. Brisk peripheral pulses without edema.  Abdomen: Soft, + BS,   Non tender, no guarding, rebound, hernias, masses. Musculoskeletal: Full ROM, 5/5 strength, Normal gait Skin: Warm, dry without rashes, lesions, ecchymosis.  Neuro: Awake and oriented X 3, Cranial nerves intact. Normal muscle tone, no cerebellar symptoms. Psych: Normal affect, Insight and Judgment appropriate.    Terri Piedraourtney Forcucci, PA-C 2:03 PM [facility]

## 2015-03-10 LAB — PROTIME-INR
INR: 2.62 — ABNORMAL HIGH (ref <1.50)
PROTHROMBIN TIME: 28.4 s — AB (ref 11.6–15.2)

## 2015-03-22 ENCOUNTER — Other Ambulatory Visit: Payer: Self-pay | Admitting: Physician Assistant

## 2015-04-12 ENCOUNTER — Telehealth: Payer: Self-pay | Admitting: Internal Medicine

## 2015-04-12 NOTE — Telephone Encounter (Signed)
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  Given that this is across state lines feel that the patient needs to be monitored by a [place] practitioner and also have them order it.

## 2015-05-19 ENCOUNTER — Other Ambulatory Visit: Payer: Self-pay | Admitting: *Deleted

## 2015-05-19 DIAGNOSIS — D6862 Lupus anticoagulant syndrome: Secondary | ICD-10-CM

## 2015-05-19 MED ORDER — WARFARIN SODIUM 5 MG PO TABS: ORAL_TABLET | ORAL | Status: DC

## 2015-10-11 ENCOUNTER — Ambulatory Visit: Payer: Self-pay | Admitting: Internal Medicine

## 2015-11-01 ENCOUNTER — Ambulatory Visit: Payer: BLUE CROSS/BLUE SHIELD | Admitting: Internal Medicine

## 2015-11-11 ENCOUNTER — Ambulatory Visit (INDEPENDENT_AMBULATORY_CARE_PROVIDER_SITE_OTHER): Payer: BLUE CROSS/BLUE SHIELD | Admitting: Internal Medicine

## 2015-11-11 ENCOUNTER — Encounter: Payer: Self-pay | Admitting: Internal Medicine

## 2015-11-11 VITALS — BP 118/62 | HR 92 | Temp 98.4°F | Resp 16 | Ht 64.75 in | Wt 243.0 lb

## 2015-11-11 DIAGNOSIS — Z79899 Other long term (current) drug therapy: Secondary | ICD-10-CM | POA: Diagnosis not present

## 2015-11-11 DIAGNOSIS — I82409 Acute embolism and thrombosis of unspecified deep veins of unspecified lower extremity: Secondary | ICD-10-CM

## 2015-11-11 DIAGNOSIS — Z131 Encounter for screening for diabetes mellitus: Secondary | ICD-10-CM

## 2015-11-11 DIAGNOSIS — Z13220 Encounter for screening for lipoid disorders: Secondary | ICD-10-CM

## 2015-11-11 DIAGNOSIS — Z7901 Long term (current) use of anticoagulants: Secondary | ICD-10-CM | POA: Diagnosis not present

## 2015-11-11 DIAGNOSIS — Z1329 Encounter for screening for other suspected endocrine disorder: Secondary | ICD-10-CM

## 2015-11-11 LAB — CBC WITH DIFFERENTIAL/PLATELET
BASOS ABS: 0 {cells}/uL (ref 0–200)
Basophils Relative: 0 %
EOS ABS: 98 {cells}/uL (ref 15–500)
Eosinophils Relative: 2 %
HEMATOCRIT: 39.2 % (ref 35.0–45.0)
HEMOGLOBIN: 13 g/dL (ref 11.7–15.5)
LYMPHS PCT: 39 %
Lymphs Abs: 1911 cells/uL (ref 850–3900)
MCH: 26.6 pg — AB (ref 27.0–33.0)
MCHC: 33.2 g/dL (ref 32.0–36.0)
MCV: 80.3 fL (ref 80.0–100.0)
MONO ABS: 539 {cells}/uL (ref 200–950)
MPV: 9.4 fL (ref 7.5–12.5)
Monocytes Relative: 11 %
NEUTROS PCT: 48 %
Neutro Abs: 2352 cells/uL (ref 1500–7800)
Platelets: 371 10*3/uL (ref 140–400)
RBC: 4.88 MIL/uL (ref 3.80–5.10)
RDW: 15 % (ref 11.0–15.0)
WBC: 4.9 10*3/uL (ref 3.8–10.8)

## 2015-11-11 MED ORDER — ALPRAZOLAM 0.5 MG PO TABS: 0.5000 mg | ORAL_TABLET | Freq: Two times a day (BID) | ORAL | 2 refills | Status: DC | PRN

## 2015-11-11 MED ORDER — PREDNISONE 20 MG PO TABS: ORAL_TABLET | ORAL | 0 refills | Status: DC

## 2015-11-11 MED ORDER — VENLAFAXINE HCL ER 37.5 MG PO CP24: 37.5000 mg | ORAL_CAPSULE | Freq: Every day | ORAL | 2 refills | Status: DC

## 2015-11-11 MED ORDER — RIVAROXABAN 20 MG PO TABS: 20.0000 mg | ORAL_TABLET | Freq: Every day | ORAL | 0 refills | Status: DC

## 2015-11-11 MED ORDER — RIVAROXABAN 15 MG PO TABS: 15.0000 mg | ORAL_TABLET | Freq: Every day | ORAL | 0 refills | Status: DC

## 2015-11-11 NOTE — Progress Notes (Signed)
Assessment and Plan:  Obesity -recommended starting some exercise and cutting back on portion sizes.    Cholesterol: -Continue diet and exercise.  -Check cholesterol.   Pre-diabetes: -Continue diet and exercise.  -Check A1C  Vitamin D Def: -continue medications.   History of DVT with longterm anticoagulation -PT/INR -if less than 3 transition to xarelto -prescription card given  Depression -recommended seeing therapist at school -patient already d/c zoloft -change to effexor 37.5 mg   Continue diet and meds as discussed. Further disposition pending results of labs.  HPI 20 y.o. female  presents for 3 month follow up with hypertension, hyperlipidemia, prediabetes and vitamin D.   Her blood pressure has been controlled at home, today their BP is BP: 118/62.   She does not workout. She denies chest pain, shortness of breath, dizziness.   She is on cholesterol medication and denies myalgias. Her cholesterol is at goal. The cholesterol last visit was:   Lab Results  Component Value Date   CHOL 148 08/19/2014   HDL 45 (L) 08/19/2014   LDLCALC 93 08/19/2014   TRIG 50 08/19/2014   CHOLHDL 3.3 08/19/2014     She has been working on diet and exercise for prediabetes, and denies foot ulcerations, hyperglycemia, hypoglycemia , increased appetite, nausea, paresthesia of the feet, polydipsia, polyuria, visual disturbances, vomiting and weight loss. Last A1C in the office was:  Lab Results  Component Value Date   HGBA1C 5.2 08/19/2014    Patient is on Vitamin D supplement.  Lab Results  Component Value Date   VD25OH 12 (L) 08/19/2014     Patient reports that she has been having an issue for with her foot.  She reports that the pain was on the medial side of the left foot.   She reports that she would like to try a different antidepressant.  She reports that she does not feel like it is working anymore.  She reports that school has been a little bit more stressful.  She likes  to use music to help with some of the stress.  She feels like she is getting close to panic attacks.  She has been out of xanax recently.  She was taking xanax once to twice weekly.  She has tried wellbutrin in the past and didn't really like that   She is still taking coumadin for her history of DVT.  She would like to consider changing to xarelto.    She has gained approximately 30 lbs in the last 6 months.   Current Medications:  Current Outpatient Prescriptions on File Prior to Visit  Medication Sig Dispense Refill  . ALPRAZolam (XANAX) 0.5 MG tablet TAKE 1 TABLET BY MOUTH TWICE DAILY AS NEEDED FOR ANXIETY 60 tablet 0  . sertraline (ZOLOFT) 100 MG tablet Take 1 tablet (100 mg total) by mouth daily. 30 tablet 2  . warfarin (COUMADIN) 5 MG tablet Take 1 tablet daily or as directed 90 tablet 2   No current facility-administered medications on file prior to visit.     Medical History:  Past Medical History:  Diagnosis Date  . ADD (attention deficit disorder)   . Anemia   . Clotting disorder (HCC)   . Depression   . DVT (deep venous thrombosis) [facility](HCC) Oct 2011  . Dyspnea Oct 2011  . Lupus anticoagulant with hypercoagulable state (HCC) 2011  . Migraines   . Renal failure Oct 2011  . Scoliosis   . Sickle cell trait (HCC)     Allergies: No Known  Allergies   Review of Systems:  ROS  Family history- Review and unchanged  Social history- Review and unchanged  Physical Exam: BP 118/62   Pulse 92   Temp 98.4 F (36.9 C) (Temporal)   Resp 16   Ht 5' 4.75" (1.645 m)   Wt 243 lb (110.2 kg)   LMP 10/11/2015   BMI 40.75 kg/m  Wt Readings from Last 3 Encounters:  11/11/15 243 lb (110.2 kg)  03/09/15 214 lb (97.1 kg)  12/22/14 195 lb (88.5 kg)    General Appearance: Well nourished well developed, in no apparent distress. Eyes: PERRLA, EOMs, conjunctiva no swelling or erythema ENT/Mouth: Ear canals normal without obstruction, swelling, erythma, discharge.  TMs normal  bilaterally.  Oropharynx moist, clear, without exudate, or postoropharyngeal swelling. Neck: Supple, thyroid normal,no cervical adenopathy  Respiratory: Respiratory effort normal, Breath sounds clear A&P without rhonchi, wheeze, or rale.  No retractions, no accessory usage. Cardio: RRR with no MRGs. Brisk peripheral pulses without edema.  Abdomen: Soft, + BS,  Non tender, no guarding, rebound, hernias, masses. Musculoskeletal: Full ROM, 5/5 strength, Normal gait Skin: Warm, dry without rashes, lesions, ecchymosis.  Neuro: Awake and oriented X 3, Cranial nerves intact. Normal muscle tone, no cerebellar symptoms. Psych: Normal affect, Insight and Judgment appropriate.    Terri Piedra, PA-C 4:09 PM [facility]

## 2015-11-12 LAB — BASIC METABOLIC PANEL WITH GFR
BUN: 14 mg/dL (ref 7–25)
CALCIUM: 9.5 mg/dL (ref 8.6–10.2)
CO2: 24 mmol/L (ref 20–31)
Chloride: 106 mmol/L (ref 98–110)
Creat: 0.57 mg/dL (ref 0.50–1.10)
GFR, Est Non African American: >89 mL/min (ref >=60)
GLUCOSE: 79 mg/dL (ref 65–99)
Potassium: 3.9 mmol/L (ref 3.5–5.3)
Sodium: 139 mmol/L (ref 135–146)

## 2015-11-12 LAB — HEPATIC FUNCTION PANEL
ALBUMIN: 4.3 g/dL (ref 3.6–5.1)
ALT: 28 U/L (ref 6–29)
AST: 22 U/L (ref 10–30)
Alkaline Phosphatase: 51 U/L (ref 33–115)
Bilirubin, Direct: 0.1 mg/dL (ref <=0.2)
Indirect Bilirubin: 0.3 mg/dL (ref 0.2–1.2)
TOTAL PROTEIN: 7.5 g/dL (ref 6.1–8.1)
Total Bilirubin: 0.4 mg/dL (ref 0.2–1.2)

## 2015-11-12 LAB — PROTIME-INR
INR: 1.7 — AB
PROTHROMBIN TIME: 17.8 s — AB (ref 9.0–11.5)

## 2015-11-12 LAB — LIPID PANEL
CHOL/HDL RATIO: 4.7 ratio (ref <=5.0)
CHOLESTEROL: 192 mg/dL (ref 125–200)
HDL: 41 mg/dL — ABNORMAL LOW (ref >=46)
LDL Cholesterol: 133 mg/dL — ABNORMAL HIGH (ref <130)
Triglycerides: 92 mg/dL (ref <150)
VLDL: 18 mg/dL (ref <30)

## 2015-11-12 LAB — HEMOGLOBIN A1C
Hgb A1c MFr Bld: 5.3 % (ref <5.7)
MEAN PLASMA GLUCOSE: 105 mg/dL

## 2015-11-12 LAB — TSH: TSH: 1.09 m[IU]/L

## 2015-12-15 ENCOUNTER — Encounter: Payer: Self-pay | Admitting: Physician Assistant

## 2015-12-15 ENCOUNTER — Ambulatory Visit (INDEPENDENT_AMBULATORY_CARE_PROVIDER_SITE_OTHER): Payer: BLUE CROSS/BLUE SHIELD | Admitting: Physician Assistant

## 2015-12-15 VITALS — BP 122/76 | HR 75 | Temp 97.3°F | Resp 16 | Ht 64.75 in | Wt 239.8 lb

## 2015-12-15 DIAGNOSIS — I82409 Acute embolism and thrombosis of unspecified deep veins of unspecified lower extremity: Secondary | ICD-10-CM | POA: Diagnosis not present

## 2015-12-15 DIAGNOSIS — D6862 Lupus anticoagulant syndrome: Secondary | ICD-10-CM

## 2015-12-15 DIAGNOSIS — Z79899 Other long term (current) drug therapy: Secondary | ICD-10-CM | POA: Diagnosis not present

## 2015-12-15 DIAGNOSIS — F411 Generalized anxiety disorder: Secondary | ICD-10-CM

## 2015-12-15 DIAGNOSIS — Z7901 Long term (current) use of anticoagulants: Secondary | ICD-10-CM | POA: Diagnosis not present

## 2015-12-15 MED ORDER — VENLAFAXINE HCL ER 75 MG PO CP24: 75.0000 mg | ORAL_CAPSULE | Freq: Every day | ORAL | 1 refills | Status: DC

## 2015-12-15 MED ORDER — ALPRAZOLAM 0.5 MG PO TABS
0.5000 mg | ORAL_TABLET | Freq: Two times a day (BID) | ORAL | 0 refills | Status: DC | PRN
Start: 2015-12-15 — End: 2016-03-24

## 2015-12-15 NOTE — Progress Notes (Signed)
Patient ID: Anna Butler, female   DOB: 04/20/1995, 20 y.o.   MRN: [id number]  Assessment and Plan:   Acute thromboembolism of deep veins of lower extremity, unspecified laterality (HCC) -     Protime-INR  Medication management -     Protime-INR  Morbid obesity due to excess calories (HCC) - long discussion about weight loss, diet, and exercise  Generalized anxiety disorder -     venlafaxine XR (EFFEXOR-XR) 75 MG 24 hr capsule; Take 1 capsule (75 mg total) by mouth daily.  Other orders -     ALPRAZolam (XANAX) 0.5 MG tablet; Take 1 tablet (0.5 mg total) by mouth 2 (two) times daily as needed. for anxiety   Continue diet and meds as discussed. Further disposition pending results of labs.  HPI 20 y.o. female  presents for 1 month follow up with long term coumadin use and would like an increase in her effexor.  She denies any easy bleeding, no nose bleeds, bruising, black stools, etc. No recent falls or ABX use.  10mg  M,W,F, 7.5 the rest of the time.  Lab Results  Component Value Date   INR 1.7 (H) 11/11/2015   INR 2.62 (H) 03/09/2015   INR 1.72 (H) 12/14/2014   Anxiety is doing well.  Wants to increase effexor, doing well with it. Takes xanax only as needed about 2 x a week.   BMI is Body mass index is 40.21 kg/m., she is working on diet and exercise. Wt Readings from Last 3 Encounters:  12/15/15 239 lb 12.8 oz (108.8 kg)  11/11/15 243 lb (110.2 kg)  03/09/15 214 lb (97.1 kg)     Current Medications:  Current Outpatient Prescriptions on File Prior to Visit  Medication Sig Dispense Refill  . ALPRAZolam (XANAX) 0.5 MG tablet Take 1 tablet (0.5 mg total) by mouth 2 (two) times daily as needed. for anxiety 60 tablet 2  . venlafaxine XR (EFFEXOR XR) 37.5 MG 24 hr capsule Take 1 capsule (37.5 mg total) by mouth daily. 30 capsule 2   No current facility-administered medications on file prior to visit.     Medical History:  Past Medical History:  Diagnosis Date  . ADD  (attention deficit disorder)   . Anemia   . Clotting disorder (HCC)   . Depression   . DVT (deep venous thrombosis) [facility](HCC) Oct 2011  . Dyspnea Oct 2011  . Lupus anticoagulant with hypercoagulable state (HCC) 2011  . Migraines   . Renal failure Oct 2011  . Scoliosis   . Sickle cell trait (HCC)     Allergies: No Known Allergies   Review of Systems:  Review of Systems  Constitutional: Negative for chills, fever and malaise/fatigue.  HENT: Negative for congestion, ear pain and sore throat.   Eyes: Negative.   Respiratory: Negative for cough, shortness of breath and wheezing.   Cardiovascular: Negative for chest pain, palpitations and leg swelling.  Gastrointestinal: Negative for blood in stool, constipation, diarrhea, heartburn and melena.  Genitourinary: Negative for dysuria, frequency, hematuria and urgency.  Musculoskeletal: Negative.   Neurological: Negative for dizziness, sensory change, loss of consciousness and headaches.  Psychiatric/Behavioral: Negative for depression. The patient is not nervous/anxious and does not have insomnia.     Family history- Review and unchanged  Social history- Review and unchanged  Physical Exam: BP 122/76   Pulse 75   Temp 97.3 F (36.3 C)   Resp 16   Ht 5' 4.75" (1.645 m)   Wt 239 lb 12.8 oz (  108.8 kg)   LMP 12/07/2015   SpO2 97%   BMI 40.21 kg/m  Wt Readings from Last 3 Encounters:  12/15/15 239 lb 12.8 oz (108.8 kg)  11/11/15 243 lb (110.2 kg)  03/09/15 214 lb (97.1 kg)    General Appearance: Well nourished well developed, in no apparent distress. Eyes: PERRLA, EOMs, conjunctiva no swelling or erythema ENT/Mouth: Ear canals normal without obstruction, swelling, erythma, discharge.  TMs normal bilaterally.  Oropharynx moist, clear, without exudate, or postoropharyngeal swelling. Neck: Supple, thyroid normal,no cervical adenopathy  Respiratory: Respiratory effort normal, Breath sounds clear A&P without rhonchi, wheeze, or rale.   No retractions, no accessory usage. Cardio: RRR with no MRGs. Brisk peripheral pulses without edema.  Abdomen: Soft, + BS,  Non tender, no guarding, rebound, hernias, masses. Musculoskeletal: Full ROM, 5/5 strength, Normal gait Skin: Warm, dry without rashes, lesions, ecchymosis.  Neuro: Awake and oriented X 3, Cranial nerves intact. Normal muscle tone, no cerebellar symptoms. Psych: Normal affect, Insight and Judgment appropriate.    Quentin Mulling, PA-C 4:00 PM [facility]

## 2015-12-16 LAB — PROTIME-INR
INR: 1.6 — AB
Prothrombin Time: 17.1 s — ABNORMAL HIGH (ref 9.0–11.5)

## 2015-12-19 ENCOUNTER — Other Ambulatory Visit: Payer: Self-pay | Admitting: Internal Medicine

## 2015-12-19 DIAGNOSIS — D6862 Lupus anticoagulant syndrome: Secondary | ICD-10-CM

## 2016-01-12 ENCOUNTER — Other Ambulatory Visit (INDEPENDENT_AMBULATORY_CARE_PROVIDER_SITE_OTHER): Payer: BLUE CROSS/BLUE SHIELD

## 2016-01-12 ENCOUNTER — Ambulatory Visit: Payer: Self-pay | Admitting: Physician Assistant

## 2016-01-12 DIAGNOSIS — Z79899 Other long term (current) drug therapy: Secondary | ICD-10-CM

## 2016-01-12 NOTE — Progress Notes (Signed)
Pt present for PTINR blood work Pt had no concerns @ this time.

## 2016-01-13 LAB — PROTIME-INR
INR: 2.5 — AB
Prothrombin Time: 25.6 s — ABNORMAL HIGH (ref 9.0–11.5)

## 2016-01-18 ENCOUNTER — Other Ambulatory Visit: Payer: Self-pay

## 2016-01-18 DIAGNOSIS — D6862 Lupus anticoagulant syndrome: Secondary | ICD-10-CM

## 2016-01-18 MED ORDER — WARFARIN SODIUM 5 MG PO TABS: ORAL_TABLET | ORAL | 0 refills | Status: DC

## 2016-02-10 ENCOUNTER — Telehealth: Payer: Self-pay

## 2016-02-10 ENCOUNTER — Other Ambulatory Visit: Payer: Self-pay

## 2016-02-11 NOTE — Telephone Encounter (Signed)
[facility] 1[facility].  I have that she is taking 10mg  M,W,F, 7.5 the rest of the time, we will increase to 10mg  4 days a week and 7.5mg  the rest of the time. Keep appointment in 1 month

## 2016-02-11 NOTE — Telephone Encounter (Signed)
LVM for pt to return my phone about med increase

## 2016-02-11 NOTE — Telephone Encounter (Signed)
Informed pt of med increase 10 mgs 4 days a wk & 7.5 mgs the other days Keep appt in 1 mth Pt agreed to this & voiced understanding

## 2016-02-21 ENCOUNTER — Other Ambulatory Visit: Payer: BLUE CROSS/BLUE SHIELD

## 2016-02-21 DIAGNOSIS — I82409 Acute embolism and thrombosis of unspecified deep veins of unspecified lower extremity: Secondary | ICD-10-CM

## 2016-02-21 DIAGNOSIS — Z7901 Long term (current) use of anticoagulants: Secondary | ICD-10-CM | POA: Diagnosis not present

## 2016-02-22 LAB — PROTIME-INR
INR: 2 — AB
PROTHROMBIN TIME: 20.8 s — AB (ref 9.0–11.5)

## 2016-03-07 ENCOUNTER — Other Ambulatory Visit: Payer: Self-pay | Admitting: Physician Assistant

## 2016-03-07 DIAGNOSIS — D6862 Lupus anticoagulant syndrome: Secondary | ICD-10-CM

## 2016-03-08 ENCOUNTER — Other Ambulatory Visit: Payer: Self-pay | Admitting: *Deleted

## 2016-03-08 DIAGNOSIS — D6862 Lupus anticoagulant syndrome: Secondary | ICD-10-CM

## 2016-03-08 MED ORDER — WARFARIN SODIUM 5 MG PO TABS: ORAL_TABLET | ORAL | 0 refills | Status: DC

## 2016-03-11 ENCOUNTER — Encounter: Payer: Self-pay | Admitting: *Deleted

## 2016-03-15 ENCOUNTER — Ambulatory Visit: Payer: Self-pay | Admitting: Physician Assistant

## 2016-03-23 ENCOUNTER — Emergency Department (HOSPITAL_COMMUNITY)
Admission: EM | Admit: 2016-03-23 | Discharge: 2016-03-24 | Disposition: A | Discharge status: Other Acute Inpatient Hospital | Payer: BLUE CROSS/BLUE SHIELD | Attending: Emergency Medicine | Admitting: Emergency Medicine

## 2016-03-23 ENCOUNTER — Encounter (HOSPITAL_COMMUNITY): Payer: Self-pay

## 2016-03-23 DIAGNOSIS — Z7901 Long term (current) use of anticoagulants: Secondary | ICD-10-CM | POA: Diagnosis not present

## 2016-03-23 DIAGNOSIS — F909 Attention-deficit hyperactivity disorder, unspecified type: Secondary | ICD-10-CM | POA: Diagnosis not present

## 2016-03-23 DIAGNOSIS — Z79899 Other long term (current) drug therapy: Secondary | ICD-10-CM | POA: Diagnosis not present

## 2016-03-23 DIAGNOSIS — F332 Major depressive disorder, recurrent severe without psychotic features: Secondary | ICD-10-CM | POA: Insufficient documentation

## 2016-03-23 DIAGNOSIS — R45851 Suicidal ideations: Secondary | ICD-10-CM | POA: Diagnosis present

## 2016-03-23 LAB — COMPREHENSIVE METABOLIC PANEL
ALBUMIN: 4.3 g/dL (ref 3.5–5.0)
ALT: 32 U/L (ref 14–54)
AST: 24 U/L (ref 15–41)
Alkaline Phosphatase: 44 U/L (ref 38–126)
Anion gap: 7 (ref 5–15)
BUN: 10 mg/dL (ref 6–20)
CHLORIDE: 107 mmol/L (ref 101–111)
CO2: 22 mmol/L (ref 22–32)
CREATININE: 0.56 mg/dL (ref 0.44–1.00)
Calcium: 8.8 mg/dL — ABNORMAL LOW (ref 8.9–10.3)
GFR calc Af Amer: >60 mL/min (ref >60)
GFR calc non Af Amer: >60 mL/min (ref >60)
GLUCOSE: 104 mg/dL — AB (ref 65–99)
Potassium: 4 mmol/L (ref 3.5–5.1)
SODIUM: 136 mmol/L (ref 135–145)
Total Bilirubin: 0.5 mg/dL (ref 0.3–1.2)
Total Protein: 7.5 g/dL (ref 6.5–8.1)

## 2016-03-23 LAB — CBC WITH DIFFERENTIAL/PLATELET
BASOS ABS: 0 10*3/uL (ref 0.0–0.1)
BASOS PCT: 0 %
EOS ABS: 0.1 10*3/uL (ref 0.0–0.7)
Eosinophils Relative: 1 %
HCT: 35.6 % — ABNORMAL LOW (ref 36.0–46.0)
Hemoglobin: 12.3 g/dL (ref 12.0–15.0)
LYMPHS PCT: 44 %
Lymphs Abs: 2.4 10*3/uL (ref 0.7–4.0)
MCH: 26.5 pg (ref 26.0–34.0)
MCHC: 34.6 g/dL (ref 30.0–36.0)
MCV: 76.7 fL — AB (ref 78.0–100.0)
Monocytes Absolute: 0.5 10*3/uL (ref 0.1–1.0)
Monocytes Relative: 9 %
Neutro Abs: 2.5 10*3/uL (ref 1.7–7.7)
Neutrophils Relative %: 46 %
PLATELETS: 350 10*3/uL (ref 150–400)
RBC: 4.64 MIL/uL (ref 3.87–5.11)
RDW: 14.1 % (ref 11.5–15.5)
WBC: 5.4 10*3/uL (ref 4.0–10.5)

## 2016-03-23 LAB — ACETAMINOPHEN LEVEL: Acetaminophen (Tylenol), Serum: <10 ug/mL — ABNORMAL LOW (ref 10–30)

## 2016-03-23 LAB — PROTIME-INR
INR: 1.74
Prothrombin Time: 20.5 seconds — ABNORMAL HIGH (ref 11.4–15.2)

## 2016-03-23 LAB — RAPID URINE DRUG SCREEN, HOSP PERFORMED
Amphetamines: NOT DETECTED
BARBITURATES: NOT DETECTED
BENZODIAZEPINES: POSITIVE — AB
COCAINE: NOT DETECTED
Opiates: POSITIVE — AB
Tetrahydrocannabinol: NOT DETECTED

## 2016-03-23 LAB — ETHANOL: Alcohol, Ethyl (B): <5 mg/dL (ref <5)

## 2016-03-23 LAB — SALICYLATE LEVEL: Salicylate Lvl: <7 mg/dL (ref 2.8–30.0)

## 2016-03-23 MED ORDER — VENLAFAXINE HCL ER 75 MG PO CP24
150.0000 mg | ORAL_CAPSULE | Freq: Every day | ORAL | Status: DC
  Administered 2016-03-23 – 2016-03-24 (×2): 150 mg via ORAL
  Filled 2016-03-23 (×2): qty 2

## 2016-03-23 MED ORDER — WARFARIN SODIUM 10 MG PO TABS
10.0000 mg | ORAL_TABLET | ORAL | Status: AC
  Administered 2016-03-23: 10 mg via ORAL
  Filled 2016-03-23: qty 1

## 2016-03-23 MED ORDER — WARFARIN SODIUM 10 MG PO TABS: 10.0000 mg | ORAL_TABLET | ORAL | Status: DC

## 2016-03-23 MED ORDER — BUPROPION HCL 75 MG PO TABS
75.0000 mg | ORAL_TABLET | Freq: Every day | ORAL | Status: DC
Start: 2016-03-23 — End: 2016-03-24
  Administered 2016-03-23 – 2016-03-24 (×2): 75 mg via ORAL
  Filled 2016-03-23 (×2): qty 1

## 2016-03-23 MED ORDER — WARFARIN - PHARMACIST DOSING INPATIENT: Freq: Every day | Status: DC

## 2016-03-23 MED ORDER — ACETAMINOPHEN 325 MG PO TABS: 650.0000 mg | ORAL_TABLET | Freq: Once | ORAL | Status: DC

## 2016-03-23 MED ORDER — TRAZODONE HCL 50 MG PO TABS
50.0000 mg | ORAL_TABLET | Freq: Every evening | ORAL | Status: DC | PRN
  Administered 2016-03-23: 50 mg via ORAL
  Filled 2016-03-23: qty 1

## 2016-03-23 MED ORDER — ALPRAZOLAM 0.5 MG PO TABS: 0.5000 mg | ORAL_TABLET | Freq: Two times a day (BID) | ORAL | Status: DC | PRN

## 2016-03-23 MED ORDER — WARFARIN SODIUM 7.5 MG PO TABS: 7.5000 mg | ORAL_TABLET | ORAL | Status: DC
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
## 2016-03-23 NOTE — Progress Notes (Signed)
Per Karleen HampshireSpencer, [place] meets inpatient criteria. TTS to seek placement for referral. Inaara Tye K. Sherlon HandingHarris, LCAS-A, LPC-A, [facility]  Counselor 03/23/2016 1:06 AM

## 2016-03-23 NOTE — ED Provider Notes (Signed)
[facility]ENCY DEPT Provider Note   CSN: [id number] Arrival date & time: 03/23/16  0014     History   Chief Complaint Chief Complaint  Patient presents with  . Medical Clearance    pt reported to EMS she took 2- 0.5mg  Xanax    HPI Anna Butler is a 21 y.o. female.  21 year old female with a history of antiphospholipid syndrome, on chronic Coumadin, depression, and sickle cell trait presents to the emergency department after reported overdose. Patient took 2 tablets of 0.5 mg Xanax and a few tablespoons of a codeine cough serum. She states that her intent was taking these medications was to "feel numb". She denies other ingestion as well as alcohol use tonight. She states that she has had suicidal ideations in the past, but denies a history of suicide attempt. No history of behavioral health hospitalizations. She states that she has remained compliant with her psychiatric medications. She is followed by a psychiatrist in the area. Mother called EMS for evaluation after patient admitted to ingestion.   The history is provided by the patient. No language interpreter was used.    Past Medical History:  Diagnosis Date  . ADD (attention deficit disorder)   . Anemia   . Clotting disorder (HCC)   . Depression   . DVT (deep venous thrombosis) [facility]) Oct 2011  . Dyspnea Oct 2011  . Lupus anticoagulant with hypercoagulable state (HCC) 2011  . Migraines   . Renal failure Oct 2011  . Scoliosis   . Sickle cell trait [facility])     Patient Active Problem List   Diagnosis Date Noted  . Pilonidal abscess 03/27/2012  . Long term current use of anticoagulant therapy 04/15/2010  . SHORTNESS OF BREATH 02/01/2010  . ACUTE KIDNEY FAILURE UNSPECIFIED 01/06/2010  . PNEUMONIA DUE TO OTHER SPECIFIED BACTERIA 12/15/2009  . Acute thromboembolism of deep veins of lower extremity (HCC) 12/13/2009    Past Surgical History:  Procedure Laterality Date  . INCISE AND DRAIN ABCESS     buttock abscess      OB History    No data available       Home Medications    Prior to Admission medications   Medication Sig Start Date End Date Taking? Authorizing Provider  ALPRAZolam Prudy Feeler) 0.5 MG tablet Take 1 tablet (0.5 mg total) by mouth 2 (two) times daily as needed. for anxiety 12/15/15  Yes Quentin Mulling, PA-C  venlafaxine XR (EFFEXOR-XR) 75 MG 24 hr capsule Take 1 capsule (75 mg total) by mouth daily. Patient taking differently: Take 150 mg by mouth daily.  12/15/15 12/14/16 Yes Quentin Mulling, PA-C  warfarin (COUMADIN) 5 MG tablet Take 5 mg 2 pills daily or AD by PCP. Patient taking differently: Take 5 mg by mouth. Take 10 MG on Sunday, Tuesday, Thursday and Saturday, Take 7.5 MG on Monday, Wednesday and Friday 03/08/16  Yes Lucky Cowboy, MD    Family History Family History  Problem Relation Age of Onset  . Obesity Mother   . Hypertension Mother   . Cancer Maternal Grandfather     lung  . Heart disease Maternal Grandfather     Social History Social History  Substance Use Topics  . Smoking status: Never Smoker  . Smokeless tobacco: Not on file  . Alcohol use No     Allergies   Patient has no known allergies.   Review of Systems Review of Systems Ten systems reviewed and are negative for acute change, except as noted in the HPI.  Physical Exam Updated Vital Signs BP 114/83 (BP Location: Left Arm)   Pulse 90   Temp 98.1 F (36.7 C) (Oral)   Resp 16   SpO2 100%   Physical Exam  Constitutional: She is oriented to person, place, and time. She appears well-developed and well-nourished. No distress.  Nontoxic and in NAD  HENT:  Head: Normocephalic and atraumatic.  Eyes: Conjunctivae and EOM are normal. No scleral icterus.  Neck: Normal range of motion.  Cardiovascular: Normal rate, regular rhythm and intact distal pulses.   Pulmonary/Chest: Effort normal. No respiratory distress. She has no wheezes.  Respirations even and unlabored  Musculoskeletal: Normal  range of motion.  Neurological: She is alert and oriented to person, place, and time. She exhibits normal muscle tone. Coordination normal.  GCS 15. Patient moving all extremities.  Skin: Skin is warm and dry. No rash noted. She is not diaphoretic. No erythema. No pallor.  Psychiatric: Her speech is normal and behavior is normal. She exhibits a depressed mood. She expresses suicidal ideation. She expresses suicidal plans.  Nursing note and vitals reviewed.    ED Treatments / Results  Labs (all labs ordered are listed, but only abnormal results are displayed) Labs Reviewed  CBC WITH DIFFERENTIAL/PLATELET - Abnormal; Notable for the following:       Result Value   HCT 35.6 (*)    MCV 76.7 (*)    All other components within normal limits  COMPREHENSIVE METABOLIC PANEL - Abnormal; Notable for the following:    Glucose, Bld 104 (*)    Calcium 8.8 (*)    All other components within normal limits  RAPID URINE DRUG SCREEN, HOSP PERFORMED - Abnormal; Notable for the following:    Opiates POSITIVE (*)    Benzodiazepines POSITIVE (*)    All other components within normal limits  PROTIME-INR - Abnormal; Notable for the following:    Prothrombin Time 20.5 (*)    All other components within normal limits  SALICYLATE LEVEL  ETHANOL    EKG  EKG Interpretation None       Radiology No results found.  Procedures Procedures (including critical care time)  Medications Ordered in ED Medications  acetaminophen (TYLENOL) tablet 650 mg (not administered)  ALPRAZolam (XANAX) tablet 0.5 mg (not administered)  venlafaxine XR (EFFEXOR-XR) 24 hr capsule 150 mg (not administered)  warfarin (COUMADIN) tablet 10 mg (not administered)  warfarin (COUMADIN) tablet 7.5 mg (not administered)  Warfarin - Pharmacist Dosing Inpatient (not administered)     Initial Impression / Assessment and Plan / ED Course  I have reviewed the triage vital signs and the nursing notes.  Pertinent labs & imaging  results that were available during my care of the patient were reviewed by me and considered in my medical decision making (see chart for details).     Patient presents after reported overdose. She has been medically cleared and evaluated by TTS who recommended inpatient psychiatric treatment. Patient is here voluntarily. TTS to seek placement. Disposition to be determined by oncoming ED provider.   Final Clinical Impressions(s) / ED Diagnoses   Final diagnoses:  Severe episode of recurrent major depressive disorder, without psychotic features [facility](HCC)    New Prescriptions New Prescriptions   No medications on file     Antony MaduraKelly Taryn Shellhammer, PA-C 03/23/16 0426    Paula LibraJohn Molpus, MD 03/23/16 [phone]

## 2016-03-23 NOTE — ED Notes (Signed)
Breakfast given to pt

## 2016-03-23 NOTE — ED Notes (Signed)
Pt A&O x 3, no distress noted, calm & cooperative, watching TV at present.  Monitoring for safety, Q 15 min checks in effect. 

## 2016-03-23 NOTE — ED Notes (Signed)
Pt's mother reports Pt has a condition that causes her to need to wear compression stockings when nonambulatory for an extended period of time.  This Clinical research associatewriter got approval from Psych team and provided stockings.

## 2016-03-23 NOTE — Progress Notes (Signed)
Referral submitted to: [facility], [facility], [facility], [facility], [facility], [address], [facility], [facility], [facility], [facility] [employer] K. Sherlon HandingHarris, LCAS-A, LPC-A, [facility]  Counselor 03/23/2016 2:31 AM

## 2016-03-23 NOTE — ED Notes (Signed)
Pt. In burgundy scrubs. Pt. And belongings searched and wanded by security. Pt. Has 2 belongings bag. Pt. Has 1 blue jean pant, 1 pr, brown boots, 1 red/yellow sweater, 1 beige bra, 1 cell phone and no charger and 1 red purse. Pt. Belongings locked up at the nurses station on the RES A and B zone.

## 2016-03-23 NOTE — ED Notes (Signed)
Pt oriented to room and unit.  Pt contracts for safety.  States she is depressed and is embarrassed for taking too much medication.  She is pleasant and cooperative.  Denies H/I and AVH.  15 minute checks and video monitoring in place.

## 2016-03-23 NOTE — Progress Notes (Signed)
ANTICOAGULATION CONSULT NOTE - Initial Consult  Pharmacy Consult for Warfarin Indication: DVT  No Known Allergies  Patient Measurements:   Heparin Dosing Weight:   Vital Signs: Temp: 98.1 F (36.7 C) (01/18 0350) Temp Source: Oral (01/18 0350) BP: 114/83 (01/18 0350) Pulse Rate: 90 (01/18 0350)  Labs:  Recent Labs  03/23/16 0033  HGB 12.3  HCT 35.6*  PLT 350  LABPROT 20.5*  INR 1.74  CREATININE 0.56    CrCl cannot be calculated (Unknown ideal weight.).   Medical History: Past Medical History:  Diagnosis Date  . ADD (attention deficit disorder)   . Anemia   . Clotting disorder (HCC)   . Depression   . DVT (deep venous thrombosis) [facility](HCC) Oct 2011  . Dyspnea Oct 2011  . Lupus anticoagulant with hypercoagulable state (HCC) 2011  . Migraines   . Renal failure Oct 2011  . Scoliosis   . Sickle cell trait (HCC)     Medications:   (Not in a hospital admission) Scheduled:  . acetaminophen  650 mg Oral Once  . venlafaxine XR  150 mg Oral Daily  . warfarin  10 mg Oral Once per day on Sun Tue Thu Sat  . [START ON 03/24/2016] warfarin  7.5 mg Oral Once per day on Mon Wed Fri  . Warfarin - Pharmacist Dosing Inpatient   Does not apply q1800    Assessment: Patient with hx of DVT on warfarin PTA.  Last dose noted 1/17 per med rec.  INR on admit 1.74.  Goal of Therapy:  INR 2-3    Plan:  Continue home warfarin dosing Daily INR  Anna DavidsonGrimsley Jr, Anna Butler 03/23/2016,4:51 AM

## 2016-03-23 NOTE — ED Notes (Signed)
Pt wanded by security. 

## 2016-03-23 NOTE — ED Notes (Signed)
Bed: [id number] Expected date:  Expected time:  Means of arrival:  Comments: 21 yo F/ Ingestion

## 2016-03-23 NOTE — ED Triage Notes (Signed)
Pt reported to EMS that she wants the pain to stop and then admitted to taking 2- 0.5 mg Xanax and drank some promethazine- codiene unknown amount. Denies other ingestion

## 2016-03-24 ENCOUNTER — Inpatient Hospital Stay (HOSPITAL_COMMUNITY)
Admission: AD | Admit: 2016-03-24 | Discharge: 2016-03-28 | DRG: 885 | Disposition: A | Discharge status: Home / Self Care | Payer: BLUE CROSS/BLUE SHIELD | Source: Intra-hospital | Attending: Psychiatry | Admitting: Psychiatry

## 2016-03-24 DIAGNOSIS — M419 Scoliosis, unspecified: Secondary | ICD-10-CM | POA: Diagnosis present

## 2016-03-24 DIAGNOSIS — D6861 Antiphospholipid syndrome: Secondary | ICD-10-CM | POA: Diagnosis present

## 2016-03-24 DIAGNOSIS — F411 Generalized anxiety disorder: Secondary | ICD-10-CM | POA: Diagnosis present

## 2016-03-24 DIAGNOSIS — F332 Major depressive disorder, recurrent severe without psychotic features: Principal | ICD-10-CM | POA: Diagnosis present

## 2016-03-24 DIAGNOSIS — Z915 Personal history of self-harm: Secondary | ICD-10-CM | POA: Diagnosis not present

## 2016-03-24 DIAGNOSIS — Z79899 Other long term (current) drug therapy: Secondary | ICD-10-CM

## 2016-03-24 DIAGNOSIS — R45851 Suicidal ideations: Secondary | ICD-10-CM | POA: Diagnosis present

## 2016-03-24 DIAGNOSIS — Z801 Family history of malignant neoplasm of trachea, bronchus and lung: Secondary | ICD-10-CM | POA: Diagnosis not present

## 2016-03-24 DIAGNOSIS — D573 Sickle-cell trait: Secondary | ICD-10-CM | POA: Diagnosis present

## 2016-03-24 DIAGNOSIS — Z7901 Long term (current) use of anticoagulants: Secondary | ICD-10-CM

## 2016-03-24 DIAGNOSIS — Z9889 Other specified postprocedural states: Secondary | ICD-10-CM | POA: Diagnosis not present

## 2016-03-24 DIAGNOSIS — Z8249 Family history of ischemic heart disease and other diseases of the circulatory system: Secondary | ICD-10-CM | POA: Diagnosis not present

## 2016-03-24 DIAGNOSIS — Z818 Family history of other mental and behavioral disorders: Secondary | ICD-10-CM | POA: Diagnosis not present

## 2016-03-24 LAB — PROTIME-INR
INR: 1.69
Prothrombin Time: 20.1 seconds — ABNORMAL HIGH (ref 11.4–15.2)

## 2016-03-24 MED ORDER — ALUM & MAG HYDROXIDE-SIMETH 200-200-20 MG/5ML PO SUSP: 30.0000 mL | ORAL | Status: DC | PRN

## 2016-03-24 MED ORDER — MAGNESIUM HYDROXIDE 400 MG/5ML PO SUSP: 30.0000 mL | Freq: Every day | ORAL | Status: DC | PRN

## 2016-03-24 MED ORDER — VENLAFAXINE HCL ER 150 MG PO CP24
150.0000 mg | ORAL_CAPSULE | Freq: Every day | ORAL | Status: DC
  Administered 2016-03-25 – 2016-03-27 (×3): 150 mg via ORAL
  Filled 2016-03-24 (×6): qty 1

## 2016-03-24 MED ORDER — WARFARIN - PHARMACIST DOSING INPATIENT
Freq: Every day | Status: DC
  Filled 2016-03-24 (×9): qty 1

## 2016-03-24 MED ORDER — BUPROPION HCL 75 MG PO TABS
75.0000 mg | ORAL_TABLET | Freq: Every day | ORAL | Status: DC
  Administered 2016-03-25 – 2016-03-27 (×3): 75 mg via ORAL
  Filled 2016-03-24 (×4): qty 1

## 2016-03-24 MED ORDER — TRAZODONE HCL 50 MG PO TABS
50.0000 mg | ORAL_TABLET | Freq: Every evening | ORAL | Status: DC | PRN
  Administered 2016-03-24: 50 mg via ORAL
  Filled 2016-03-24: qty 1

## 2016-03-24 MED ORDER — ACETAMINOPHEN 325 MG PO TABS: 650.0000 mg | ORAL_TABLET | Freq: Four times a day (QID) | ORAL | Status: DC | PRN

## 2016-03-24 MED ORDER — WARFARIN SODIUM 10 MG PO TABS
10.0000 mg | ORAL_TABLET | Freq: Once | ORAL | Status: AC
  Administered 2016-03-24: 10 mg via ORAL
  Filled 2016-03-24 (×2): qty 1

## 2016-03-24 NOTE — Tx Team (Signed)
Initial Treatment Plan 03/24/2016 10:10 PM Anna BoringBrittney L Turnipseed [id number]    PATIENT STRESSORS: Educational concerns Financial difficulties Health problems Marital or family conflict   PATIENT STRENGTHS: Ability for insight Average or above average intelligence Capable of independent living Wellsite geologistCommunication skills General fund of knowledge Motivation for treatment/growth Supportive family/friends   PATIENT IDENTIFIED PROBLEMS: "work on my self esteem and confidence"  "learn how to manage my stress"  Depression  Anxiety               DISCHARGE CRITERIA:  Ability to meet basic life and health needs Adequate post-discharge living arrangements Improved stabilization in mood, thinking, and/or behavior Medical problems require only outpatient monitoring Motivation to continue treatment in a less acute level of care Need for constant or close observation no longer present Reduction of life-threatening or endangering symptoms to within safe limits Safe-care adequate arrangements made Verbal commitment to aftercare and medication compliance  PRELIMINARY DISCHARGE PLAN: Outpatient therapy  PATIENT/FAMILY INVOLVEMENT: This treatment plan has been presented to and reviewed with the patient, Anna Butler.  The patient and family have been given the opportunity to ask questions and make suggestions.  Ferrel LoganAmanda A Keelie Zemanek, RN 03/24/2016, 10:10 PM

## 2016-03-24 NOTE — ED Notes (Signed)
We have been waiting on [facility] transportation for two hours.  Pt is calm and cooperative.  Her mother informed us that her INR should be at 2.5.  [facility] about [facility].  Since transfer is taking so long [facility] pharmacist is moving her coumadin dose to now.

## 2016-03-24 NOTE — ED Notes (Signed)
Pt discharged safely with [facility] driver.  All belongings sent with pt. 

## 2016-03-24 NOTE — Progress Notes (Addendum)
Nursing Progress Note 7p-7a  D) Patient presents with flat affect but is cooperative with Clinical research associatewriter. Patient reports this is her first in-patient stay and she feels "over-whelmed". Patient reports that "a few days ago I never thought I would end up here". Patient states her goals here are to "work on my self confidence and self-esteem and learn how to deal with stress". When asked, patient states "everything stresses me, especially the future". Patient reports wanting to talk to the psychiatrist because "i think I might be bipolar but I am undiagnosed". Patient denies SI/HI/AVH or pain at this time. Patient contracts for safety at this time. Patient expresses need for warfarin medication tomorrow.  A) Emotional support given. Patient medicated with PM trazodone as prescribed. Medication reviewed with patient. Patient on q15 min safety checks. Opportunities for questions or concerns presented to patient. Patient encouraged to continue to prepare to work on treatment goals tomorrow. [facility]. Orders are active.  R) Patient receptive to interaction with nurse. Patient remains safe on the unit at this time. Patient is interacting in the dayroom. Will continue to monitor.

## 2016-03-24 NOTE — ED Notes (Signed)
Phlebotomy called for lab draw of PT/INR

## 2016-03-24 NOTE — Progress Notes (Signed)
ANTICOAGULATION CONSULT NOTE - Follow Up  Pharmacy Consult for Warfarin Indication: DVT  No Known Allergies  Patient Measurements:   Heparin Dosing Weight:   Vital Signs: Temp: 98.1 F (36.7 C) (01/19 0622) Temp Source: Oral (01/19 0622) BP: 123/75 (01/19 0622) Pulse Rate: 103 (01/19 0622)  Labs:  Recent Labs  03/23/16 0033 03/24/16 0650  HGB 12.3  --   HCT 35.6*  --   PLT 350  --   LABPROT 20.5* 20.1*  INR 1.74 1.69  CREATININE 0.56  --     CrCl cannot be calculated (Unknown ideal weight.).   Medical History: Past Medical History:  Diagnosis Date  . ADD (attention deficit disorder)   . Anemia   . Clotting disorder (HCC)   . Depression   . DVT (deep venous thrombosis) [facility](HCC) Oct 2011  . Dyspnea Oct 2011  . Lupus anticoagulant with hypercoagulable state (HCC) 2011  . Migraines   . Renal failure Oct 2011  . Scoliosis   . Sickle cell trait (HCC)     Medications:   (Not in a hospital admission) Scheduled:  . acetaminophen  650 mg Oral Once  . buPROPion  75 mg Oral Q1200  . venlafaxine XR  150 mg Oral Daily  . warfarin  10 mg Oral Once per day on Sun Tue Thu Sat  . warfarin  7.5 mg Oral Once per day on Mon Wed Fri  . Warfarin - Pharmacist Dosing Inpatient   Does not apply q1800    Assessment: Patient with hx of DVT on warfarin PTA.  Last dose noted 1/17 per med rec.  INR on admit 1.74. Home dose PTA reportedly 10mg  daily except 7.5mg  on MWF.   Today, 03/24/16  INR subtherapeutic after 10mg  1/18  No reported bleeding  Goal of Therapy:  INR 2-3    Plan:  1) Repeat 10mg  warfarin today 2) Daily INR 3) Check CBC tomorrow   Hessie KnowsJustin M Charvi Gammage, PharmD, BCPS Pager [phone] 03/24/2016 9:43 AM

## 2016-03-24 NOTE — Plan of Care (Signed)
Problem: Education: Goal: Knowledge of [facility] Education information/materials will improve Outcome: Progressing Patient [facility] to the [facility]. Patient provided materials to reinforce information. No questions or concerns at this time.  Problem: Safety: Goal: Periods of time without injury will increase Outcome: Progressing Patient remains safe on the unit; patient contracts for safety. Patient is on q15 min safety checks.

## 2016-03-24 NOTE — Progress Notes (Signed)
Adult Psychoeducational Group Note  Date:  03/24/2016 Time:  9:18 PM  Group Topic/Focus:  Wrap-Up Group:   The focus of this group is to help patients review their daily goal of treatment and discuss progress on daily workbooks.  Participation Level:  Active  Participation Quality:  Appropriate  Affect:  Appropriate  Cognitive:  Alert  Insight: Appropriate  Engagement in Group:  Engaged  Modes of Intervention:  Discussion  Additional Comments:  Pt stated that today has been weird. Her goal is to lower her depression and anxiety.  Kaleen OdeaCOOKE, Olamide Carattini R 03/24/2016, 9:18 PM

## 2016-03-24 NOTE — BH Assessment (Signed)
[facility] Assessment Progress Note  Per Thedore MinsMojeed Akintayo, MD, this pt requires psychiatric hospitalization at this time.  Lillia AbedLindsay, RN, [id number]; they will be ready to receive pt at 14:00.  Pt has signed Voluntary Admission and Consent for Treatment, as well as Consent to Release Information to her parents, and signed forms have [facility].  Pt's nurse, Kendal Hymendie, has been notified, and agrees to send original paperwork along with pt via [facility] [place], and to call report to [phone].  Doylene Canninghomas Keyon Winnick, MA Triage Specialist [phone]

## 2016-03-25 ENCOUNTER — Encounter (HOSPITAL_COMMUNITY): Payer: Self-pay | Admitting: *Deleted

## 2016-03-25 DIAGNOSIS — F332 Major depressive disorder, recurrent severe without psychotic features: Principal | ICD-10-CM

## 2016-03-25 DIAGNOSIS — Z818 Family history of other mental and behavioral disorders: Secondary | ICD-10-CM

## 2016-03-25 DIAGNOSIS — Z79899 Other long term (current) drug therapy: Secondary | ICD-10-CM

## 2016-03-25 DIAGNOSIS — Z8489 Family history of other specified conditions: Secondary | ICD-10-CM

## 2016-03-25 DIAGNOSIS — R45851 Suicidal ideations: Secondary | ICD-10-CM

## 2016-03-25 DIAGNOSIS — Z8249 Family history of ischemic heart disease and other diseases of the circulatory system: Secondary | ICD-10-CM

## 2016-03-25 DIAGNOSIS — Z801 Family history of malignant neoplasm of trachea, bronchus and lung: Secondary | ICD-10-CM

## 2016-03-25 MED ORDER — HYDROXYZINE HCL 25 MG PO TABS
25.0000 mg | ORAL_TABLET | Freq: Four times a day (QID) | ORAL | Status: DC | PRN
  Administered 2016-03-25 (×2): 25 mg via ORAL
  Filled 2016-03-25 (×2): qty 1

## 2016-03-25 MED ORDER — WARFARIN SODIUM 10 MG PO TABS
10.0000 mg | ORAL_TABLET | Freq: Once | ORAL | Status: AC
  Administered 2016-03-25: 10 mg via ORAL
  Filled 2016-03-25: qty 1

## 2016-03-25 MED ORDER — TRAZODONE HCL 50 MG PO TABS
50.0000 mg | ORAL_TABLET | Freq: Every evening | ORAL | Status: DC | PRN
  Administered 2016-03-25 – 2016-03-26 (×4): 50 mg via ORAL
  Filled 2016-03-25 (×9): qty 1

## 2016-03-25 NOTE — Progress Notes (Signed)
D: Pt presents with flat affect and depressed mood. Pt appears disheveled this morning during assessment. Pt denied active suicidal thoughts with Clinical research associatewriter. Pt reported passive suicidal thoughts with no plan or intent. Pt verbally contracts for safety. Pt rates depression 7/10. Anxiety 10/10. Pt given vistaril at her request for anxiety. Pt reported that she's been depressed since 8 th grade and that therapy and medications have not helped in the past.  A: Medications reviewed with pt. Medications administered as ordered per MD. Verbal support provided. Pt encouraged to attend groups. 15 minute checks performed for safety.  R: Pt receptive to tx. Pt compliant with tx.

## 2016-03-25 NOTE — Plan of Care (Signed)
Problem: Activity: Goal: Interest or engagement in activities will improve Outcome: Progressing Patient is attending groups and participating in care. Patient is interactive in the milieu with peers.  Problem: Medication: Goal: Compliance with prescribed medication regimen will improve Outcome: Progressing Patient is taking medications, attending groups and interacting with staff appropriately.

## 2016-03-25 NOTE — H&P (Signed)
Psychiatric Admission Assessment Adult  Patient Identification: Anna Butler MRN:  [id number] Date of Evaluation:  03/25/2016 Chief Complaint:  MDD Principal Diagnosis: Major depressive disorder, recurrent severe without psychotic features (HCC) Diagnosis:   Patient Active Problem List   Diagnosis Date Noted  . Major depressive disorder, recurrent severe without psychotic features (HCC) [F33.2] 03/24/2016  . Major depressive disorder, recurrent episode (HCC) [F33.9] 03/23/2016  . Pilonidal abscess [L05.01] 03/27/2012  . Long term current use of anticoagulant therapy [Z79.01] 04/15/2010  . SHORTNESS OF BREATH [R06.02] 02/01/2010  . ACUTE KIDNEY FAILURE UNSPECIFIED [N17.9] 01/06/2010  . PNEUMONIA DUE TO OTHER SPECIFIED BACTERIA [J15.8] 12/15/2009  . Acute thromboembolism of deep veins of lower extremity (HCC) [I82.409] 12/13/2009   CC: Depression and Anxiety    History of Present Illness: Anna Butler is an 21 y.o. female., African American who presents to ED per ED report: history of antiphospholipid syndrome, on chronic Coumadin, depression, and sickle cell trait presents to the emergency department after reported overdose. Patient took 2 tablets of 0.5 mg Xanax and a few tablespoons of a codeine cough serum. She states that her intent was taking these medications was to "feel numb". She denies other ingestion as well as alcohol use tonight. She states that she has had suicidal ideations in the past, but denies a history of suicide attempt. No history of behavioral health hospitalizations. She states that she has remained compliant with her psychiatric medications. She is followed by a psychiatrist in the area. Mother called EMS for evaluation after patient admitted to ingestion. Patient states primary concern is of elevated depression with active SI. Patient states no change in plan.  Patient resides with mother.  Patient acknowledges current SI with intent to o.d.[name] Patient denies  HI and AVH. Patient denies S.A. Patient denies hx. Of inpatient psych care. Patient is seen outpatient for psych care at [facility].  Upon admission to the unit: 21 year old female who presents voluntary, in no acute distress, for the treatment of SI and Depression. Patient appears flat and depressed. Patient was calm and cooperative with admission process. Patient presents with passive SI and contracts for safety upon admission. Patient denies AVH.  Prior to admission, patient took 2 tablets of 0.5mg  of Xanax and codeine cough syrup.  Patient has complaints of "anxiety, depression, panic attacks, and self harm thoughts".  Patient reports prior suicide attempt "two months ago" via "overdose on Xanax".  Patient reports hx of being bullied at school.  Patient currently lives with her mother and identifies mother as her support system.  Skin was assessed and found to be clear of any abnormal marks.  Patient searched and no contraband found, POC and unit policies explained and understanding verbalized. Consents obtained. Food and fluids offered and accepted. Patient had no additional questions or concerns.  Associated Signs/Symptoms: Depression Symptoms:  depressed mood, anxiety, (Hypo) Manic Symptoms:  Irritable Mood, Frustrated  Anxiety Symptoms:  Excessive Worry, Panic Symptoms,  Psychotic Symptoms:  Denies  PTSD Symptoms: NA Total Time spent with patient: 45 minutes  Past Psychiatric History:ADD, MDD, Self-injurious behaviors (cutting)   Is the patient at risk to self? Yes.    Has the patient been a risk to self in the past 6 months? No.  Has the patient been a risk to self within the distant past? Yes.    Is the patient a risk to others? No.  Has the patient been a risk to others in the past 6 months? No.  Has  the patient been a risk to others within the distant past? No.   Prior Inpatient Therapy:  None to prior to this admission Prior Outpatient Therapy:  [name]  - therapist  Alcohol Screening: 1. How often do you have a drink containing alcohol?: 2 to 4 times a month 2. How many drinks containing alcohol do you have on a typical day when you are drinking?: 1 or 2 3. How often do you have six or more drinks on one occasion?: Never Preliminary Score: 0 9. Have you or someone else been injured as a result of your drinking?: No 10. Has a relative or friend or a doctor or another health worker been concerned about your drinking or suggested you cut down?: No Alcohol Use Disorder Identification Test Final Score (AUDIT): 2 Brief Intervention: AUDIT score less than 7 or less-screening does not suggest unhealthy drinking-brief intervention not indicated Substance Abuse History in the last 12 months:  Yes.  Patient denies substance abuse hx prior to suicide attempt. Pt UDS on admission was positive for benzodiazepines and opiates.  Consequences of Substance Abuse: Medical Consequences:  Liver, cardiac, and brain damage  Legal Consequences:  Drug charges, DWI, Jail time, Probation Family Consequences:  Family discord, seperation  Previous Psychotropic Medications: No  Psychological Evaluations: Denies  Past Medical History:  Past Medical History:  Diagnosis Date  . ADD (attention deficit disorder)   . Anemia   . Clotting disorder (HCC)   . Depression   . DVT (deep venous thrombosis) [facility]) Oct 2011  . Dyspnea Oct 2011  . Lupus anticoagulant with hypercoagulable state (HCC) 2011  . Migraines   . Renal failure Oct 2011  . Scoliosis   . Sickle cell trait [facility])     Past Surgical History:  Procedure Laterality Date  . INCISE AND DRAIN ABCESS     buttock abscess   Family History:  Family History  Problem Relation Age of Onset  . Obesity Mother   . Hypertension Mother   . Cancer Maternal Grandfather     lung  . Heart disease Maternal Grandfather    Family Psychiatric  History: Father- Anxiety and Depression, Paternal grandmother-anxiety, maternal  aunt-Bipolar  Tobacco Screening: Have you used any form of tobacco in the last 30 days? (Cigarettes, Smokeless Tobacco, Cigars, and/or Pipes): No Social History:  History  Alcohol Use No     History  Drug Use No    Additional Social History:                           Allergies:  No Known Allergies Lab Results:  Results for orders placed or performed during the hospital encounter of 03/23/16 (from the past 48 hour(s))  Protime-INR     Status: Abnormal   Collection Time: 03/24/16  6:50 AM  Result Value Ref Range   Prothrombin Time 20.1 (H) 11.4 - 15.2 seconds   INR 1.69     Blood Alcohol level:  Lab Results  Component Value Date   ETH <5 03/23/2016    Metabolic Disorder Labs:  Lab Results  Component Value Date   HGBA1C 5.3 11/11/2015   MPG 105 11/11/2015   MPG 103 08/19/2014   No results found for: PROLACTIN Lab Results  Component Value Date   CHOL 192 11/11/2015   TRIG 92 11/11/2015   HDL 41 (L) 11/11/2015   CHOLHDL 4.7 11/11/2015   VLDL 18 11/11/2015   LDLCALC 133 (H) 11/11/2015  LDLCALC 93 08/19/2014    Current Medications: Current Facility-Administered Medications  Medication Dose Route Frequency Provider Last Rate Last Dose  . acetaminophen (TYLENOL) tablet 650 mg  650 mg Oral Q6H PRN Charm RingsJamison Y Lord, NP      . alum & mag hydroxide-simeth (MAALOX/MYLANTA) 200-200-20 MG/5ML suspension 30 mL  30 mL Oral Q4H PRN Charm RingsJamison Y Lord, NP      . buPROPion [facility](WELLBUTRIN) tablet 75 mg  75 mg Oral Q1200 Charm RingsJamison Y Lord, NP   75 mg at 03/25/16 1112  . hydrOXYzine (ATARAX/VISTARIL) tablet 25 mg  25 mg Oral Q6H PRN Adonis BrookSheila Agustin, NP   25 mg at 03/25/16 0905  . magnesium hydroxide (MILK OF MAGNESIA) suspension 30 mL  30 mL Oral Daily PRN Charm RingsJamison Y Lord, NP      . traZODone (DESYREL) tablet 50 mg  50 mg Oral QHS PRN Charm RingsJamison Y Lord, NP   50 mg at 03/24/16 2207  . venlafaxine XR (EFFEXOR-XR) 24 hr capsule 150 mg  150 mg Oral Daily Charm RingsJamison Y Lord, NP   150 mg at  03/25/16 0756  . warfarin (COUMADIN) tablet 10 mg  10 mg Oral ONCE-1800 Craige CottaFernando A Cobos, MD      . Warfarin - Pharmacist Dosing Inpatient   Does not apply q1800 Charm RingsJamison Y Lord, NP       PTA Medications: Prescriptions Prior to Admission  Medication Sig Dispense Refill Last Dose  . venlafaxine XR (EFFEXOR-XR) 75 MG 24 hr capsule Take 1 capsule (75 mg total) by mouth daily. (Patient taking differently: Take 150 mg by mouth daily. ) 90 capsule 1 03/22/2016 at Unknown time  . warfarin (COUMADIN) 5 MG tablet Take 5 mg 2 pills daily or AD by PCP. (Patient taking differently: Take 5 mg by mouth. Take 10 MG on Sunday, Tuesday, Thursday and Saturday, Take 7.5 MG on Monday, Wednesday and Friday) 90 tablet 0 03/22/2016 at 2200    Musculoskeletal: Strength & Muscle Tone: within normal limits Gait & Station: normal Patient leans: N/A  Psychiatric Specialty Exam: Physical Exam  Constitutional: She appears well-developed.  HENT:  Head: Normocephalic.  Neck: Normal range of motion.    Review of Systems  Psychiatric/Behavioral: Positive for substance abuse (Admission for suicide attempt UDS posititve for benzos and opiates ).    Blood pressure 113/69, pulse 100, temperature 98.1 F (36.7 C), temperature source Oral, resp. rate 16, height 5\' 3"  (1.6 m), weight 111 kg (244 lb 11.4 oz), SpO2 98 %.Body mass index is 43.35 kg/m.  General Appearance: Well Groomed  Eye Contact:  Fair  Speech:  Clear and Coherent and Normal Rate  Volume:  Normal  Mood:  Anxious and Depressed  Affect:  Depressed  Thought Process:  Coherent and Goal Directed  Orientation:  Full (Time, Place, and Person)  Thought Content:  Logical  Suicidal Thoughts:  Yes.  without intent/plan  Homicidal Thoughts:  No  Memory:  Immediate;   Fair Recent;   Fair Remote;   Fair  Judgement:  Fair  Insight:  Fair  Psychomotor Activity:  Normal  Concentration:  Concentration: Fair and Attention Span: Fair  Recall:  [employer]is[employer]Fair  Fund of  Knowledge:  Fair  Language:  Good  Akathisia:  No  Handed:  Right  AIMS (if indicated):     Assets:  Desire for Improvement Others:  to feel normal and not depressed   ADL's:  Intact  Cognition:  WNL  Sleep:  Number of Hours: 6.25    Treatment Plan  Summary: Daily contact with patient to assess and evaluate symptoms and progress in treatment and Medication management  Plan: 1. [facility] the service of Dr. Jama Flavors. 2.  Routine labs, which include CBC, CMP, UDS, UA, and medical consultation were reviewed and routine PRN's were ordered for the patient. 3. Will maintain Q 15 minutes observation for safety.  Estimated LOS:  5-7 days 4. During this hospitalization the patient will receive psychosocial  Assessment. 5. Patient will participate in  group, milieu, and family therapy. Psychotherapy: Social and Doctor, hospital, anti-bullying, learning based strategies, cognitive behavioral, and family object relations individuation separation intervention psychotherapies can be considered.  6. To reduce current symptoms to base line and improve the patient's overall level of functioning will adjust Medication management as follow: 7. Adelina Lelan Pons and parent/guardian were educated about medication efficacy and side effects.   8. Will continue to monitor patient's mood and behavior. 9. Social Work will schedule a Family meeting to obtain collateral information and discuss discharge and follow up plan.  Discharge concerns will also be addressed:  Safety, stabilization, and access to medication.  10. This visit was of moderate complexity. It exceeded 30 minutes and 50% of this visit was spent in discussing coping mechanisms, patient's social situation, reviewing records from and  contacting family to get consent for medication and also discussing patient's presentation and obtaining history. Observation Level/Precautions:   15 minute checks  Laboratory:  Labs obtained have been assessed and reviewed at this time. Will order TSH, A1c, and lipid panel.   Psychotherapy:  Individual and Group therapy  Medications:  Continue Bupropion 75 mg daily and Venlafaxine XR 150 mg daily, For depression, mood stabilization and anxiety.   Consultations:  Per need  Discharge Concerns:  Safety  Estimated LOS: 5-7 days  Other:     Physician Treatment Plan for Primary Diagnosis: Major depressive disorder, recurrent severe without psychotic features (HCC) Long Term Goal(s): Improvement in symptoms so as ready for discharge  Short Term Goals: Ability to identify changes in lifestyle to reduce recurrence of condition will improve, Ability to verbalize feelings will improve, Ability to disclose and discuss suicidal ideas and Ability to demonstrate self-control will improve  Physician Treatment Plan for Secondary Diagnosis: Principal Problem:   Major depressive disorder, recurrent severe without psychotic features (HCC)  Long Term Goal(s): Improvement in symptoms so as ready for discharge  Short Term Goals: Ability to identify and develop effective coping behaviors will improve, Ability to maintain clinical measurements within normal limits will improve, Compliance with prescribed medications will improve and Ability to identify triggers associated with substance abuse/mental health issues will improve  I certify that inpatient services furnished can reasonably be expected to improve the patient's condition.    Truman Hayward, FNP 1/20/201811:34 AM

## 2016-03-25 NOTE — [facility] Group Notes (Signed)
Adult Therapy Group Note  Date:  03/25/2016 Time: 2:15-3:05pm  Group Topic/Focus:  Discussion of healthy versus unhealthy coping techniques, and what specific healthy coping techniques patients would like to learn.  Examples given, reasons discussed, and healthy group conversation was held.  Healthy coping skills desired include:  Not holding in feelings to the point of exploding, meditation, learning how to handle social situations, positive self-talk replacing the negative, communication skills, talking about things, not letting small things build up and bother them.  Participation Level:  Active  Participation Quality:  Attentive and Sharing  Affect:  Blunted and Depressed  Cognitive:  Appropriate  Insight: Improving  Engagement in Group:  Engaged  Modes of Intervention:  Discussion and Exploration  Additional Comments:  Pt stated she would like to learn more yoga and meditation skills, as well as how to get along with others in social situations.  She did not talk much but did so when called upon directly.  Carloyn JaegerMareida J Grossman-Orr 03/25/2016, 4:30 PM

## 2016-03-25 NOTE — Progress Notes (Signed)
ANTICOAGULATION CONSULT NOTE - Follow Up Consult  Pharmacy Consult for Coumadin  Indication:dvt  No Known Allergies  Patient Measurements: Height: 5\' 3"  (160 cm) Weight: 244 lb 11.4 oz (111 kg) IBW/kg (Calculated) : 52.4 Vital Signs: Temp: 98.1 F (36.7 C) (01/20 0805) Temp Source: Oral (01/20 0805) BP: 113/69 (01/20 0807) Pulse Rate: 118 (01/20 0807)  Labs:  Recent Labs  03/23/16 0033 03/24/16 0650  HGB 12.3  --   HCT 35.6*  --   PLT 350  --   LABPROT 20.5* 20.1*  INR 1.74 1.69  CREATININE 0.56  --     Estimated Creatinine Clearance: 133.1 mL/min (by C-G formula based on SCr of 0.56 mg/dL).   Medications:  Scheduled:  . buPROPion  75 mg Oral Q1200  . venlafaxine XR  150 mg Oral Daily  . warfarin  10 mg Oral ONCE-1800  . Warfarin - Pharmacist Dosing Inpatient   Does not apply q1800    Assessment: INR below goal on 03/25/16.  Coumadin 10 mg given on 03/25/16.  INR missed this AM, lab does not collect labs during day.  No problems noted.  Well below goal of INR 2.5 yesterday.  Goal of Therapy:  INR goal 2-3     Plan:  Coumadin 10 mg x 1 today  PT/INR in AM    Anna FothergillPayne, Anna Butler 03/25/2016,8:34 AM

## 2016-03-25 NOTE — Progress Notes (Signed)
Admission Note:  21 year old female who presents voluntary, in no acute distress, for the treatment of SI and Depression. Patient appears flat and depressed. Patient was calm and cooperative with admission process. Patient presents with passive SI and contracts for safety upon admission. Patient denies AVH.  Prior to admission, patient took 2 tablets of 0.5mg  of Xanax and codeine cough syrup.  Patient has complaints of "anxiety, depression, panic attacks, and self harm thoughts".  Patient reports prior suicide attempt "two months ago" via "overdose on Xanax".  Patient reports hx of being bullied at school.  Patient currently lives with her mother and identifies mother as her support system.  Skin was assessed and found to be clear of any abnormal marks.  Patient searched and no contraband found, POC and unit policies explained and understanding verbalized. Consents obtained. Food and fluids offered and accepted. Patient had no additional questions or concerns.

## 2016-03-25 NOTE — Plan of Care (Signed)
Problem: Coping: Goal: Ability to verbalize feelings will improve Outcome: Progressing Pt verbalized depressive symptoms to Clinical research associatewriter.

## 2016-03-25 NOTE — Progress Notes (Signed)
Adult Psychoeducational Group Note  Date:  03/25/2016 Time:  0915  Group Topic/Focus:  Coping With Mental Health Crisis:   The purpose of this group is to help patients identify strategies for coping with mental health crisis.  Group discusses possible causes of crisis and ways to manage them effectively.  Participation Level:  Active  Participation Quality:  Appropriate  Affect:  Anxious  Cognitive:  Appropriate  Insight: Appropriate  Engagement in Group:  Engaged  Modes of Intervention:  Discussion and Education  Additional Comments:    Aidric Endicott L 03/25/2016, 2:34 PM

## 2016-03-25 NOTE — Progress Notes (Signed)
Nursing Progress Note 7p-7a  D) Patient presents flat and depressed but is pleasant and cooperative with Clinical research associatewriter. Patient reports having a "good day" and states she had a good visit with her mother that "decreased my anxiety". Patient states her anxiety "was high" but that vistaril was effective. Patient requested to switch beds because "the other one is more comfortable". Patient denies SI/HI/AVH or pain. Patient contracts for safety at this time. Patient reports not sleeping well last night and requests full dose of PRN trazodone.  A) Emotional support given. Patient medicated with PM orders as prescribed. Medications reviewed with patient. Patient on q15 min safety checks. Patient moved to bed 2 in room per request and Charge RN approval. Opportunities for questions or concerns presented to patient. Patient encouraged to continue to work on treatment goals.  R) Patient receptive to interaction with nurse. Patient remains safe on the unit at this time. Patient is resting in bed without complaints. Will continue to monitor.
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
## 2016-03-26 ENCOUNTER — Other Ambulatory Visit: Payer: Self-pay

## 2016-03-26 DIAGNOSIS — Z9889 Other specified postprocedural states: Secondary | ICD-10-CM

## 2016-03-26 LAB — PROTIME-INR
INR: 2.03
Prothrombin Time: 23.3 seconds — ABNORMAL HIGH (ref 11.4–15.2)

## 2016-03-26 MED ORDER — WARFARIN SODIUM 10 MG PO TABS
10.0000 mg | ORAL_TABLET | Freq: Once | ORAL | Status: AC
  Administered 2016-03-26: 10 mg via ORAL
  Filled 2016-03-26: qty 1

## 2016-03-26 NOTE — BHH Group Notes (Signed)
[facility] Group Notes:  (Clinical Social Work)   03/26/2016    1:15-2:15PM  Summary of Progress/Problems:   The main focus of today's process group was to   1)  discuss the importance of adding supports  2)  define healthy supports versus unhealthy supports  3)  identify the patient's current unhealthy supports and plan how to handle them  4)  Identify the patient's current healthy supports and plan what to add.  An emphasis was placed on using counselor, doctor, therapy groups, 12-step groups, and problem-specific support groups to expand supports.    The patient expressed full comprehension of the concepts presented, and agreed that there is a need to add more supports.  The patient stated her parents and especially her mother are healthy supports, while her school is unhealthy, because it has an atmosphere of focusing on extroverts instead of introverts like herself.  She also does not like the counseling center, does not feel they are helpful since a person can only be seen about every 3 weeks.  She stated she would have killed herself a long time ago if it was not for her mother.  Type of Therapy:  Process Group with Motivational Interviewing  Participation Level:  Active  Participation Quality:  Attentive  Affect:  Blunted  Cognitive:  Appropriate  Insight:  Developing/Improving  Engagement in Therapy:  Engaged  Modes of Intervention:   Education, Support and Processing  Ambrose MantleMareida Grossman-Orr, LCSW 03/26/2016    6:04 PM

## 2016-03-26 NOTE — Progress Notes (Signed)
[facility]  03/26/2016 4:29 PM Anna Butler  MRN:  [id number] Subjective:  Patient states that she is ok.  She feels nervous.  Anxious Objective:  Anna Butler is admitted with MDD.  She took Xanax and codeine to sleep.  Denies suicide attempt.   Principal Problem: Major depressive disorder, recurrent severe without psychotic features (HCC) Diagnosis:   Patient Active Problem List   Diagnosis Date Noted  . Major depressive disorder, recurrent severe without psychotic features (HCC) [F33.2] 03/24/2016  . Major depressive disorder, recurrent episode (HCC) [F33.9] 03/23/2016  . Pilonidal abscess [L05.01] 03/27/2012  . Long term current use of anticoagulant therapy [Z79.01] 04/15/2010  . SHORTNESS OF BREATH [R06.02] 02/01/2010  . ACUTE KIDNEY FAILURE UNSPECIFIED [N17.9] 01/06/2010  . PNEUMONIA DUE TO OTHER SPECIFIED BACTERIA [J15.8] 12/15/2009  . Acute thromboembolism of deep veins of lower extremity (HCC) [I82.409] 12/13/2009   Total Time spent with patient: 30 minutes  Past Psychiatric History: see HPI  Past Medical History:  Past Medical History:  Diagnosis Date  . ADD (attention deficit disorder)   . Anemia   . Clotting disorder (HCC)   . Depression   . DVT (deep venous thrombosis) [facility](HCC) Oct 2011  . Dyspnea Oct 2011  . Lupus anticoagulant with hypercoagulable state (HCC) 2011  . Migraines   . Renal failure Oct 2011  . Scoliosis   . Sickle cell trait [facility](HCC)     Past Surgical History:  Procedure Laterality Date  . INCISE AND DRAIN ABCESS     buttock abscess   Family History:  Family History  Problem Relation Age of Onset  . Obesity Mother   . Hypertension Mother   . Cancer Maternal Grandfather     lung  . Heart disease Maternal Grandfather    Family Psychiatric  History:  See HPI Social History:  History  Alcohol Use No     History  Drug Use No    Social History   Social History  . Marital status: Single    Spouse name: N/A  . Number of  children: N/A  . Years of education: N/A   Social History Main Topics  . Smoking status: Never Smoker  . Smokeless tobacco: Never Used  . Alcohol use No  . Drug use: No  . Sexual activity: Not Asked   Other Topics Concern  . None   Social History Narrative   [place]   Dad lives out of state   Likes drama class best at school [employer] 10th grade   Good student only missed 2 days in 2010   Additional Social History:                         Sleep: Good  Appetite:  Good  Current Medications: Current Facility-Administered Medications  Medication Dose Route Frequency Provider Last Rate Last Dose  . acetaminophen (TYLENOL) tablet 650 mg  650 mg Oral Q6H PRN Charm RingsJamison Y Lord, NP      . alum & mag hydroxide-simeth (MAALOX/MYLANTA) 200-200-20 MG/5ML suspension 30 mL  30 mL Oral Q4H PRN Charm RingsJamison Y Lord, NP      . buPROPion [facility](WELLBUTRIN) tablet 75 mg  75 mg Oral Q1200 Charm RingsJamison Y Lord, NP   75 mg at 03/26/16 1137  . hydrOXYzine (ATARAX/VISTARIL) tablet 25 mg  25 mg Oral Q6H PRN Adonis BrookSheila Jazion Atteberry, NP   25 mg at 03/25/16 1814  . magnesium hydroxide (MILK OF MAGNESIA)  suspension 30 mL  30 mL Oral Daily PRN Charm Rings, NP      . traZODone (DESYREL) tablet 50 mg  50 mg Oral QHS,MR X 1 Oneta Rack, NP   50 mg at 03/25/16 2233  . venlafaxine XR (EFFEXOR-XR) 24 hr capsule 150 mg  150 mg Oral Daily Charm Rings, NP   150 mg at 03/26/16 0752  . warfarin (COUMADIN) tablet 10 mg  10 mg Oral ONCE-1800 Craige Cotta, MD      . Warfarin - Pharmacist Dosing Inpatient   Does not apply q1800 Charm Rings, NP        Lab Results:  Results for orders placed or performed during the hospital encounter of 03/24/16 (from the past 48 hour(s))  Protime-INR     Status: Abnormal   Collection Time: 03/26/16  6:30 AM  Result Value Ref Range   Prothrombin Time 23.3 (H) 11.4 - 15.2 seconds   INR 2.03     Comment: Performed at [facility], [address], [place], [place] [address]    Blood Alcohol level:  Lab Results  Component Value Date   ETH <5 03/23/2016    Metabolic Disorder Labs: Lab Results  Component Value Date   HGBA1C 5.3 11/11/2015   MPG 105 11/11/2015   MPG 103 08/19/2014   No results found for: PROLACTIN Lab Results  Component Value Date   CHOL 192 11/11/2015   TRIG 92 11/11/2015   HDL 41 (L) 11/11/2015   CHOLHDL 4.7 11/11/2015   VLDL 18 11/11/2015   LDLCALC 133 (H) 11/11/2015   LDLCALC 93 08/19/2014    Physical Findings: AIMS: Facial and Oral Movements Muscles of Facial Expression: None, normal Lips and Perioral Area: None, normal Jaw: None, normal Tongue: None, normal,Extremity Movements Upper (arms, wrists, hands, fingers): None, normal Lower (legs, knees, ankles, toes): None, normal, Trunk Movements Neck, shoulders, hips: None, normal, Overall Severity Severity of abnormal movements (highest score from questions above): None, normal Incapacitation due to abnormal movements: None, normal Patient's awareness of abnormal movements (rate only patient's report): No Awareness, Dental Status Current problems with teeth and/or dentures?: No Does patient usually wear dentures?: No  CIWA:    COWS:     Musculoskeletal: Strength & Muscle Tone: within normal limits Gait & Station: normal Patient leans: N/A  Psychiatric Specialty Exam: Physical Exam  ROS  Blood pressure 119/60, pulse (!) 115, temperature 98.9 F (37.2 C), temperature source Oral, resp. rate 18, height 5\' 3"  (1.6 m), weight 111 kg (244 lb 11.4 oz), SpO2 98 %.Body mass index is 43.35 kg/m.   General Appearance: Well Groomed  Eye Contact:  Fair  Speech:  Clear and Coherent and Normal Rate  Volume:  Normal  Mood:  Anxious and Depressed  Affect:  Depressed  Thought Process:  Coherent and Goal Directed  Orientation:  Full (Time, Place, and Person)  Thought Content:  Logical  Suicidal Thoughts:  Yes.  without  intent/plan  Homicidal Thoughts:  No  Memory:  Immediate;   Fair Recent;   Fair Remote;   Fair  Judgement:  Fair  Insight:  Fair  Psychomotor Activity:  Normal  Concentration:  Concentration: Fair and Attention Span: Fair  Recall:  [employer] of Knowledge:  Fair  Language:  Good  Akathisia:  No  Handed:  Right  AIMS (if indicated):     Assets:  Desire for Improvement Others:  to feel normal and not depressed   ADL's:  Intact  Cognition:  WNL  Sleep:  Number of Hours: 6.25    Treatment Plan Summary: Review of chart, vital signs, medications, and notes.  1-Individual and group therapy  2-Medication management for depression and anxiety: Medications reviewed with the patient and she stated no untoward effects, unchanged.  ECG WNL, patient c/o heart rate elevated. 3-Coping skills for depression, anxiety  4-Continue crisis stabilization and management  5-Address health issues--monitoring vital signs, stable  6-Treatment plan in progress to prevent relapse of depression and anxiety  Lindwood Qua, NP [facility] 03/26/2016, 4:29 PM

## 2016-03-26 NOTE — B[facility] Group Notes (Signed)
[facility] Group Notes:  (Nursing/MHT/Case Management/Adjunct)  Date:  03/26/2016  Time:  0930 am  Type of Therapy:  Psychoeducational Skills  Participation Level:  Active  Participation Quality:  Appropriate and Attentive  Affect:  Appropriate  Cognitive:  Alert and Appropriate  Insight:  Good  Engagement in Group:  Supportive  Modes of Intervention:  Support  Summary of Progress/Problems: Patient talked about thing they like to do.  Patient states she likes to travel and been to lots of places.  Cranford MonBeaudry, Latoya Diskin Evans 03/26/2016, 10:23 AM
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
## 2016-03-26 NOTE — Progress Notes (Signed)
D: Patient is observed in day room.  She interacts little with her peers.  She has flat affect with sad, depressed mood.  She is sleeping and eating well; her energy is normal and her concentration is good.  She denies any thoughts of self harm today.  She rates her depression as a 5; hopelessness as a 4; anxiety as a 6.  Her goal today is to "think people aren't talking about me."   A: Continue to monitor medication management and MD orders.  Safety checks completed every 15 minutes per protocol.  Offer support and encouragement as needed. R: Patient is receptive to staff; her behavior is appropriate.

## 2016-03-26 NOTE — Plan of Care (Signed)
Problem: Coping: Goal: Ability to verbalize frustrations and anger appropriately will improve Outcome: Not Progressing Patient appears guarded and has little interaction with peers and staff.

## 2016-03-27 LAB — PROTIME-INR
INR: 1.94
Prothrombin Time: 22.5 seconds — ABNORMAL HIGH (ref 11.4–15.2)

## 2016-03-27 LAB — PREGNANCY, URINE: Preg Test, Ur: NEGATIVE

## 2016-03-27 MED ORDER — BUPROPION HCL ER (SR) 150 MG PO TB12
150.0000 mg | ORAL_TABLET | Freq: Every day | ORAL | Status: DC
  Administered 2016-03-28: 150 mg via ORAL
  Filled 2016-03-27 (×4): qty 1

## 2016-03-27 MED ORDER — WARFARIN SODIUM 10 MG PO TABS
10.0000 mg | ORAL_TABLET | Freq: Once | ORAL | Status: AC
  Administered 2016-03-27: 10 mg via ORAL
  Filled 2016-03-27: qty 1

## 2016-03-27 MED ORDER — VENLAFAXINE HCL ER 37.5 MG PO CP24
37.5000 mg | ORAL_CAPSULE | Freq: Every day | ORAL | Status: DC
  Administered 2016-03-28: 37.5 mg via ORAL
  Filled 2016-03-27 (×3): qty 1

## 2016-03-27 MED ORDER — TRAZODONE HCL 50 MG PO TABS
50.0000 mg | ORAL_TABLET | Freq: Every evening | ORAL | Status: DC | PRN
Start: 2016-03-27 — End: 2016-03-28
  Administered 2016-03-27: 50 mg via ORAL
  Filled 2016-03-27: qty 1

## 2016-03-27 NOTE — Progress Notes (Signed)
Adult Psychoeducational Group Note  Date:  03/27/2016 Time:  11:51 PM  Group Topic/Focus:  Wrap-Up Group:   The focus of this group is to help patients review their daily goal of treatment and discuss progress on daily workbooks.  Participation Level:  Active  Participation Quality:  Appropriate  Affect:  Appropriate  Cognitive:  Appropriate  Insight: Good  Engagement in Group:  Engaged  Modes of Intervention:  Discussion  Additional Comments:  Patient goal was to pretend she was home but practice her coping skills. Patient has succesfully accomplished her goal.  Casilda CarlsKELLY, Donnis Phaneuf H 03/27/2016, 11:51 PM

## 2016-03-27 NOTE — Tx Team (Signed)
Interdisciplinary Treatment and Diagnostic Plan Update  03/27/2016 Time of Session: 10:28 AM  Anna Butler MRN: [id number]  Principal Diagnosis: Major depressive disorder, recurrent severe without psychotic features ([place])  Secondary Diagnoses: Principal Problem:   Major depressive disorder, recurrent severe without psychotic features ([place])   Current Medications:  Current Facility-Administered Medications  Medication Dose Route Frequency Provider Last Rate Last Dose  . acetaminophen (TYLENOL) tablet 650 mg  650 mg Oral Q6H PRN Patrecia Pour, NP      . alum & mag hydroxide-simeth (MAALOX/MYLANTA) 200-200-20 MG/5ML suspension 30 mL  30 mL Oral Q4H PRN Patrecia Pour, NP      . buPROPion [facility]) tablet 75 mg  75 mg Oral Q1200 Patrecia Pour, NP   75 mg at 03/26/16 1137  . hydrOXYzine (ATARAX/VISTARIL) tablet 25 mg  25 mg Oral Q6H PRN Kerrie Buffalo, NP   25 mg at 03/25/16 1814  . magnesium hydroxide (MILK OF MAGNESIA) suspension 30 mL  30 mL Oral Daily PRN Patrecia Pour, NP      . traZODone (DESYREL) tablet 50 mg  50 mg Oral QHS,MR X 1 Derrill Center, NP   50 mg at 03/26/16 2319  . venlafaxine XR (EFFEXOR-XR) 24 hr capsule 150 mg  150 mg Oral Daily Patrecia Pour, NP   150 mg at 03/27/16 0759  . warfarin (COUMADIN) tablet 10 mg  10 mg Oral ONCE-1800 Jenne Campus, MD      . Warfarin - Pharmacist Dosing Inpatient   Does not apply q1800 Patrecia Pour, NP        PTA Medications: Prescriptions Prior to Admission  Medication Sig Dispense Refill Last Dose  . venlafaxine XR (EFFEXOR-XR) 75 MG 24 hr capsule Take 1 capsule (75 mg total) by mouth daily. (Patient taking differently: Take 150 mg by mouth daily. ) 90 capsule 1 03/22/2016 at Unknown time  . warfarin (COUMADIN) 5 MG tablet Take 5 mg 2 pills daily or AD by PCP. (Patient taking differently: Take 5 mg by mouth. Take 10 MG on Sunday, Tuesday, Thursday and Saturday, Take 7.5 MG on Monday, Wednesday and Friday) 90 tablet 0  03/22/2016 at 2200    Treatment Modalities: Medication Management, Group therapy, Case management,  1 to 1 session with clinician, Psychoeducation, Recreational therapy.  Patient Stressors: Network engineer difficulties Health problems Marital or family conflict  Patient Strengths: Ability for insight Average or above average intelligence Capable of independent living Curator fund of knowledge Motivation for treatment/growth Supportive family/friends  Physician Treatment Plan for Primary Diagnosis: Major depressive disorder, recurrent severe without psychotic features ([place]) Long Term Goal(s): Improvement in symptoms so as ready for discharge  Short Term Goals: Ability to identify changes in lifestyle to reduce recurrence of condition will improve Ability to verbalize feelings will improve Ability to disclose and discuss suicidal ideas Ability to demonstrate self-control will improve Ability to identify and develop effective coping behaviors will improve Ability to maintain clinical measurements within normal limits will improve Compliance with prescribed medications will improve Ability to identify triggers associated with substance abuse/mental health issues will improve  Medication Management: Evaluate patient's response, side effects, and tolerance of medication regimen.  Therapeutic Interventions: 1 to 1 sessions, Unit Group sessions and Medication administration.  Evaluation of Outcomes: Not Met  Physician Treatment Plan for Secondary Diagnosis: Principal Problem:   Major depressive disorder, recurrent severe without psychotic features ([place])   Long Term Goal(s): Improvement in symptoms so as ready for  discharge  Short Term Goals: Ability to identify changes in lifestyle to reduce recurrence of condition will improve Ability to verbalize feelings will improve Ability to disclose and discuss suicidal ideas Ability to demonstrate  self-control will improve Ability to identify and develop effective coping behaviors will improve Ability to maintain clinical measurements within normal limits will improve Compliance with prescribed medications will improve Ability to identify triggers associated with substance abuse/mental health issues will improve  Medication Management: Evaluate patient's response, side effects, and tolerance of medication regimen.  Therapeutic Interventions: 1 to 1 sessions, Unit Group sessions and Medication administration.  Evaluation of Outcomes: Not Met   RN Treatment Plan for Primary Diagnosis: Major depressive disorder, recurrent severe without psychotic features ([place]) Long Term Goal(s): Knowledge of disease and therapeutic regimen to maintain health will improve  Short Term Goals: Ability to verbalize feelings will improve and Ability to identify and develop effective coping behaviors will improve  Medication Management: RN will administer medications as ordered by provider, will assess and evaluate patient's response and provide education to patient for prescribed medication. RN will report any adverse and/or side effects to prescribing provider.  Therapeutic Interventions: 1 on 1 counseling sessions, Psychoeducation, Medication administration, Evaluate responses to treatment, Monitor vital signs and CBGs as ordered, Perform/monitor CIWA, COWS, AIMS and Fall Risk screenings as ordered, Perform wound care treatments as ordered.  Evaluation of Outcomes: Not Met   LCSW Treatment Plan for Primary Diagnosis: Major depressive disorder, recurrent severe without psychotic features ([place]) Long Term Goal(s): Safe transition to appropriate next level of care at discharge, Engage patient in therapeutic group addressing interpersonal concerns.  Short Term Goals: Engage patient in aftercare planning with referrals and resources and Increase skills for wellness and recovery  Therapeutic Interventions:  Assess for all discharge needs, 1 to 1 time with Social worker, Explore available resources and support systems, Assess for adequacy in community support network, Educate family and significant other(s) on suicide prevention, Complete Psychosocial Assessment, Interpersonal group therapy.  Evaluation of Outcomes: Not Met   Progress in Treatment: Attending groups: Yes Participating in groups: Yes Taking medication as prescribed: Yes, MD continues to assess for medication changes as needed Toleration medication: Yes, no side effects reported at this time Family/Significant other contact made: Yes with mother Patient understands diagnosis: Continuing to assess Discussing patient identified problems/goals with staff: Yes Medical problems stabilized or resolved: Yes Denies suicidal/homicidal ideation: Yes Issues/concerns per patient self-inventory: None Other: N/A  New problem(s) identified: None identified at this time.   New Short Term/Long Term Goal(s): None identified at this time.   Discharge Plan or Barriers: Pt will return home and follow-up with outpatient services.   Reason for Continuation of Hospitalization: Anxiety Depression Medication stabilization  Estimated Length of Stay: 2-3 days  Attendees: Patient: 03/27/2016  10:28 AM  Physician: Dr. Parke Poisson 03/27/2016  10:28 AM  Nursing: Bosie Helper, RN 03/27/2016  10:28 AM  RN Care Manager: Lars Pinks, RN 03/27/2016  10:28 AM  Social Worker: Adriana Reams, LCSW; [place], LCSW 03/27/2016  10:28 AM  Recreational Therapist:  03/27/2016  10:28 AM  Other: Lindell Spar, NP; Ricky Ala, NP 03/27/2016  10:28 AM  Other:  03/27/2016  10:28 AM  Other: 03/27/2016  10:28 AM    Scribe for Treatment Team: Gladstone Lighter, LCSW 03/27/2016 10:28 AM

## 2016-03-27 NOTE — Plan of Care (Signed)
Problem: Activity: Goal: Sleeping patterns will improve Outcome: Progressing Patient reports she slept well last night. Patient is currently resting in bed without issue or concerns. Respirations even and unlabored.  Problem: Activity: Goal: Interest or engagement in leisure activities will improve Outcome: Progressing Patient interactive with peers in day room, playing games and attending groups.

## 2016-03-27 NOTE — Progress Notes (Signed)
res ANTICOAGULATION CONSULT NOTE - Follow Up Consult  Pharmacy Consult for Coumadin Indication: dvt  No Known Allergies  Patient Measurements: Height: 5\' 3"  (160 cm) Weight: 244 lb 11.4 oz (111 kg) IBW/kg (Calculated) : 52.4   Vital Signs: Temp: 98.4 F (36.9 C) (01/22 0615) Temp Source: Oral (01/22 0615) BP: 121/74 (01/22 0616) Pulse Rate: 106 (01/22 0616)  Labs:  Recent Labs  03/26/16 0630 03/27/16 0620  LABPROT 23.3* 22.5*  INR 2.03 1.94    Estimated Creatinine Clearance: 133.1 mL/min (by C-G formula based on SCr of 0.56 mg/dL).   Medications:  Scheduled:  . buPROPion  75 mg Oral Q1200  . traZODone  50 mg Oral QHS,MR X 1  . venlafaxine XR  150 mg Oral Daily  . warfarin  10 mg Oral ONCE-1800  . Warfarin - Pharmacist Dosing Inpatient   Does not apply q1800    Assessment: INR today 1.94.  No problem noted with therapy.   Goal of Therapy:  INR 2-3    Plan:  Coumadin 10 mg x 1 today Pt/inr in am   Charyl DancerPayne, Penne Rosenstock Marie 03/27/2016,7:57 AM

## 2016-03-27 NOTE — Progress Notes (Signed)
D: Pt denies SI/HI/AVH. Pt is pleasant and cooperative. Pt stated she was depressed and anxious before coming in here, and pt stopped taking her meds. Pt stated she feels better because she has learned a lot since being here.   A: Pt was offered support and encouragement. Pt was given scheduled medications. Pt was encourage to attend groups. Q 15 minute checks were done for safety.   R:Pt attends groups and interacts well with peers and staff. Pt is taking medication. Pt has no complaints.Pt receptive to treatment and safety maintained on unit.

## 2016-03-27 NOTE — [facility] Group Notes (Signed)
Adult Psychoeducational Group Note  Date:  03/27/2016 Time:  10:34 AM  Group Topic/Focus:  Goals Group:   The focus of this group is to help patients establish daily goals to achieve during treatment and discuss how the patient can incorporate goal setting into their daily lives to aide in recovery.  Participation Level:  Active  Participation Quality:  Appropriate  Affect:  Appropriate  Cognitive:  Appropriate  Insight: Appropriate and Good  Engagement in Group:  Engaged  Modes of Intervention:  Discussion  Additional Comments:  Pt stated her day was a 10. She was feeling good and she slept great.  Pt stated a positive thing about herself is that she is very determined.  Berlin HunWatlington, Jirah Rider A 03/27/2016, 10:34 AM

## 2016-03-27 NOTE — Progress Notes (Signed)
D: Pt presents with flat affect. Pt reports decreased depression 3/10.  Pt rates anxiety 5/10. Pt no longer endorsing active suicidal thoughts. Pt reports good sleep at bedtime and continues to take trazodone 100 mg for sleep.Pt visible on the unit and engages with other patients on the unit. A: Medications reviewed with pt. Medications administered as ordered per MD. Verbal support provided. 15 minute checks performed for safety.  R: Pt receptive to tx. Pt compliant with tx.

## 2016-03-27 NOTE — Plan of Care (Signed)
Problem: Self-Concept: Goal: Level of anxiety will decrease Outcome: Progressing Pt reports decreased anxiety.

## 2016-03-27 NOTE — [facility] Suicide Risk Assessment (Signed)
[facility] INPATIENT:  Family/Significant Other Suicide Prevention Education  Suicide Prevention Education:  Education Completed; Cory MunchSheran Tremain, Pt's mother [phone], has been identified by the patient as the family member/significant other with whom the patient will be residing, and identified as the person(s) who will aid the patient in the event of a mental health crisis (suicidal ideations/suicide attempt).  With written consent from the patient, the family member/significant other has been provided the following suicide prevention education, prior to the and/or following the discharge of the patient.  The suicide prevention education provided includes the following:  Suicide risk factors  Suicide prevention and interventions  National Suicide Hotline telephone number  [facility] assessment telephone number  [facility] 911  [facility] and/or [facility] telephone number  Request made of family/significant other to:  Remove weapons (e.g., guns, rifles, knives), all items previously/currently identified as safety concern.    Remove drugs/medications (over-the-counter, prescriptions, illicit drugs), all items previously/currently identified as a safety concern.  The family member/significant other verbalizes understanding of the suicide prevention education information provided.  The family member/significant other agrees to remove the items of safety concern listed above.  Pt's mother is hopeful that Pt will be able to stay until tomorrow, but also feels that Pt is being guarded. Pt's mother is hopeful to have Pt connected to outpatient services at discharge.   Verdene LennertLauren C Cheikh Bramble 03/27/2016, 10:34 AM

## 2016-03-27 NOTE — Progress Notes (Signed)
Pt attend group her day was a 5. She had an up and down kind of day. Her goal was to be more social while she's here.

## 2016-03-27 NOTE — [facility] Group Notes (Signed)
[facility] LCSW Group Therapy  03/27/2016 1:15pm  Type of Therapy:  Group Therapy vercoming Obstacles  Participation Level:  Reserved  Participation Quality:  Appropriate   Affect:  Appropriate  Cognitive:  Appropriate and Oriented  Insight:  Developing/Improving and Improving  Engagement in Therapy:  Improving  Modes of Intervention:  Discussion, Exploration, Problem-solving and Support  Description of Group:   In this group patients will be encouraged to explore what they see as obstacles to their own wellness and recovery. They will be guided to discuss their thoughts, feelings, and behaviors related to these obstacles. The group will process together ways to cope with barriers, with attention given to specific choices patients can make. Each patient will be challenged to identify changes they are motivated to make in order to overcome their obstacles. This group will be process-oriented, with patients participating in exploration of their own experiences as well as giving and receiving support and challenge from other group members.  Summary of Patient Progress: Pt expressed that she is feeling overwhelmed facing her current obstacles. Pt was attentive in group discussion, and was receptive to feedback. Pt offered affirming statements to peers and she was able to identify the importance of creating a plan to deal with her obstacles.    Therapeutic Modalities:   Cognitive Behavioral Therapy Solution Focused Therapy Motivational Interviewing Relapse Prevention Therapy   Vernie ShanksLauren Khaliyah Northrop, LCSW 03/27/2016 2:42 PM

## 2016-03-27 NOTE — Progress Notes (Signed)
Recreation Therapy Notes  Date: 03/27/16 Time: 0930 Location: 300 Hall Group Room  Group Topic: Stress Management  Goal Area(s) Addresses:  Patient will verbalize importance of using healthy stress management.  Patient will identify positive emotions associated with healthy stress management.   Behavioral Response: Engaged  Intervention: Guided Imagery  Activity :  Peaceful Place.  LRT introduced the stress management technique of guided imagery.  LRT read a script that allowed patients to take a mental journey to their favorite place.  Patients were to listen and follow along as the LRT read the script.  Education:  Stress Management, Discharge Planning.   Education Outcome: Acknowledges edcuation/In group clarification offered/Needs additional education  Clinical Observations/Feedback: Pt attended group.   Rebakah Cokley, LRT/CTRS         Janella Rogala A 03/27/2016 11:52 AM 

## 2016-03-27 NOTE — Progress Notes (Signed)
Nursing Progress Note 7p-7a  D) Patient presents pleasant and cooperative. Patient denies SI/HI/AVH or pain. Patient is seen interacting with peers in the dayroom. Patient is seen smiling, laughing and playing games. Patient contracts for safety at this time. Patient states she is "doing better" but that she still feels depressed.  A) Emotional support given. Patient medicated with PM orders as prescribed. Medications reviewed with patient. Patient on q15 min safety checks. Opportunities for questions or concerns presented to patient. Patient encouraged to continue to work on treatment goals.  R) Patient receptive to interaction with nurse. Patient remains safe on the unit at this time. Patient is resting in bed without complaints. Will continue to monitor.

## 2016-03-27 NOTE — Progress Notes (Signed)
[facility] MD Progress Note  03/27/2016 12:34 PM Anna Butler  MRN:  [id number] Subjective:  Patient reports she is feeling better, and is denying any current suicidal or self injurious ideations . Denies medication side effects.  Objective:   I have discussed case with treatment team and have met with patient . Patient is a 21 year old female, college student, admitted following overdose on 2 Xanax tablets and on OTC cough syrup. At this time patient states she does not feel overdose was suicidal in nature, as " trying to get relief, feel numb". She does endorse history of depression and anxiety.  At this time feeling better, states she feels her mood is improved, less depressed . Anxiety is chronic but states it has responded well to Vistaril PRNs. Denies medication side effects- of note, patient has history of autoimmune, antiphospholipid syndrome and is on coumadin. She had been on Effexor XR prior to admission, unsure if it was helping, states Wellbutrin has been well tolerated and helpful during past trials. No disruptive or agitated behaviors on unit, going to groups, socializing with selected peers  Principal Problem: Major depressive disorder, recurrent severe without psychotic features ([place]) Diagnosis:   Patient Active Problem List   Diagnosis Date Noted  . Major depressive disorder, recurrent severe without psychotic features ([place]) [F33.2] 03/24/2016  . Major depressive disorder, recurrent episode ([place]) [F33.9] 03/23/2016  . Pilonidal abscess [L05.01] 03/27/2012  . Long term current use of anticoagulant therapy [Z79.01] 04/15/2010  . SHORTNESS OF BREATH [R06.02] 02/01/2010  . ACUTE KIDNEY FAILURE UNSPECIFIED [N17.9] 01/06/2010  . PNEUMONIA DUE TO OTHER SPECIFIED BACTERIA [J15.8] 12/15/2009  . Acute thromboembolism of deep veins of lower extremity (HCC) [I82.409] 12/13/2009   Total Time spent with patient: 20 minutes   Past Psychiatric History: see HPI  Past Medical History:   Past Medical History:  Diagnosis Date  . ADD (attention deficit disorder)   . Anemia   . Clotting disorder ([place])   . Depression   . DVT (deep venous thrombosis) [facility]) Oct 2011  . Dyspnea Oct 2011  . Lupus anticoagulant with hypercoagulable state ([place]) 2011  . Migraines   . Renal failure Oct 2011  . Scoliosis   . Sickle cell trait [facility])     Past Surgical History:  Procedure Laterality Date  . INCISE AND DRAIN ABCESS     buttock abscess   Family History:  Family History  Problem Relation Age of Onset  . Obesity Mother   . Hypertension Mother   . Cancer Maternal Grandfather     lung  . Heart disease Maternal Grandfather    Family Psychiatric  History:  See HPI Social History:  History  Alcohol Use No     History  Drug Use No    Social History   Social History  . Marital status: Single    Spouse name: N/A  . Number of children: N/A  . Years of education: N/A   Social History Main Topics  . Smoking status: Never Smoker  . Smokeless tobacco: Never Used  . Alcohol use No  . Drug use: No  . Sexual activity: Not Asked   Other Topics Concern  . None   Social History Narrative   Lives with Andelyn Spade here in [place]   Dad lives out of state   Likes drama class best at school [employer] 10th grade   Good student only missed 2 days in 2010   Additional Social History:   Sleep: Good  Appetite:  Good  Current Medications: Current Facility-Administered Medications  Medication Dose Route Frequency Provider Last Rate Last Dose  . acetaminophen (TYLENOL) tablet 650 mg  650 mg Oral Q6H PRN Patrecia Pour, NP      . alum & mag hydroxide-simeth (MAALOX/MYLANTA) 200-200-20 MG/5ML suspension 30 mL  30 mL Oral Q4H PRN Patrecia Pour, NP      . buPROPion [facility] SR) 12 hr tablet 150 mg  150 mg Oral Daily Myer Peer Jaecob Lowden, MD      . hydrOXYzine (ATARAX/VISTARIL) tablet 25 mg  25 mg Oral Q6H PRN Kerrie Buffalo, NP   25 mg at 03/25/16 1814  . magnesium  hydroxide (MILK OF MAGNESIA) suspension 30 mL  30 mL Oral Daily PRN Patrecia Pour, NP      . traZODone (DESYREL) tablet 50 mg  50 mg Oral QHS PRN Jenne Campus, MD      . Derrill Memo ON 03/28/2016] venlafaxine XR (EFFEXOR-XR) 24 hr capsule 37.5 mg  37.5 mg Oral Daily Myer Peer Baker Moronta, MD      . warfarin (COUMADIN) tablet 10 mg  10 mg Oral ONCE-1800 Jenne Campus, MD      . Warfarin - Pharmacist Dosing Inpatient   Does not apply Asher, NP        Lab Results:  Results for orders placed or performed during the hospital encounter of 03/24/16 (from the past 48 hour(s))  Protime-INR     Status: Abnormal   Collection Time: 03/26/16  6:30 AM  Result Value Ref Range   Prothrombin Time 23.3 (H) 11.4 - 15.2 seconds   INR 2.03     Comment: Performed at [facility], [place] [address] [place], [place] [address]  Protime-INR     Status: Abnormal   Collection Time: 03/27/16  6:20 AM  Result Value Ref Range   Prothrombin Time 22.5 (H) 11.4 - 15.2 seconds   INR 1.94     Comment: Performed at [facility], [place] [address] [place], [place] [address]    Blood Alcohol level:  Lab Results  Component Value Date   ETH <5 93/23/5573    Metabolic Disorder Labs: Lab Results  Component Value Date   HGBA1C 5.3 11/11/2015   MPG 105 11/11/2015   MPG 103 08/19/2014   No results found for: PROLACTIN Lab Results  Component Value Date   CHOL 192 11/11/2015   TRIG 92 11/11/2015   HDL 41 (L) 11/11/2015   CHOLHDL 4.7 11/11/2015   VLDL 18 11/11/2015   LDLCALC 133 (H) 11/11/2015   LDLCALC 93 08/19/2014    Physical Findings: AIMS: Facial and Oral Movements Muscles of Facial Expression: None, normal Lips and Perioral Area: None, normal Jaw: None, normal Tongue: None, normal,Extremity Movements Upper (arms, wrists, hands, fingers): None, normal Lower (legs, knees, ankles, toes): None, normal, Trunk Movements Neck, shoulders, hips: None, normal, Overall  Severity Severity of abnormal movements (highest score from questions above): None, normal Incapacitation due to abnormal movements: None, normal Patient's awareness of abnormal movements (rate only patient's report): No Awareness, Dental Status Current problems with teeth and/or dentures?: No Does patient usually wear dentures?: No  CIWA:    COWS:     Musculoskeletal: Strength & Muscle Tone: within normal limits Gait & Station: normal Patient leans: N/A  Psychiatric Specialty Exam: Physical Exam  ROS denies headache, denies chest pain , no shortness of breath, no vomiting, no bleeding , has history of episodes of epistaxis,  none currently   Blood pressure 121/74, pulse (!) 106, temperature 98.4 F (36.9 C), temperature source Oral, resp. rate 18, height _0  (1.6 m), weight 111 kg (244 lb 11.4 oz), SpO2 98 %.Body mass index is 43.35 kg/m.   General Appearance: Well Groomed  Eye Contact:  good  Speech:  Normal Rate  Volume:  Normal  Mood:  improving , less depressed   Affect: more reactive, smiles at times appropriately   Thought Process:  Coherent and Goal Directed  Orientation:  Full (Time, Place, and Person)  Thought Content:  Linear   Suicidal Thoughts:  at this time denies any suicidal or self injurious ideations, denies any homicidal or violent ideations   Homicidal Thoughts:  No  Memory: recent and remote grossly intact  Judgement:  improving   Insight:  improving   Psychomotor Activity:  Normal  Concentration:  Concentration: good and Attention Span: good  Recall:  good  Fund of Knowledge:  good  Language:  Good  Akathisia:  No  Handed:  Right  AIMS (if indicated):     Assets:  Desire for Improvement Others:  to feel normal and not depressed   ADL's:  Intact  Cognition:  WNL  Sleep:  Number of Hours: 6.25   Assessment - patient is improving , feels less depressed, mood and affect much improved compared to admission . Denies any SI or self injurious ideations  at this time. Is tolerating medications well , of note, currently on combination of Effexor XR/Wellbutrin - states she feels Effexor was not as effective for her, and Wellbutrin  was just restarted , but she remembers it as effective and well tolerated during a past trial -agrees to start tapering off Effexor in order to simplify treatment regimen and avoid possible side effects/drug- drug interactions  Treatment Plan Summary: Encourage ongoing group and milieu participation, to work on coping skills and symptom reduction Treatment team working on discharge planning options Continue Coumadin management as per pharmacy, for history of antiphospholipid syndrome Initiate Effexor XR taper- cut down to 37.5 mgrs QDAY  Continue Wellbutrin SR at 150 mgrs QDAY for depression  Continue Vistaril 25 mgrs Q 6 hours PRN for anxiety Continue Trazodone 50 mgrs QHS PRN for anxiety    Neita Garnet, MD  03/27/2016, 12:34 PM   Patient ID: Anna Butler, female   DOB: 06-03-1995, 21 y.o.   MRN: [id number]

## 2016-03-28 ENCOUNTER — Other Ambulatory Visit: Payer: Self-pay | Admitting: Internal Medicine

## 2016-03-28 LAB — PROTIME-INR
INR: 1.89
PROTHROMBIN TIME: 21.9 s — AB (ref 11.4–15.2)

## 2016-03-28 MED ORDER — VENLAFAXINE HCL ER 37.5 MG PO CP24: 37.5000 mg | ORAL_CAPSULE | Freq: Every day | ORAL | 0 refills | Status: DC

## 2016-03-28 MED ORDER — WARFARIN SODIUM 7.5 MG PO TABS
15.0000 mg | ORAL_TABLET | Freq: Once | ORAL | Status: DC
  Filled 2016-03-28: qty 2

## 2016-03-28 MED ORDER — BUPROPION HCL ER (SR) 150 MG PO TB12: 150.0000 mg | ORAL_TABLET | Freq: Every day | ORAL | 0 refills | Status: DC

## 2016-03-28 MED ORDER — TRAZODONE HCL 50 MG PO TABS: 50.0000 mg | ORAL_TABLET | Freq: Every evening | ORAL | 0 refills | Status: DC | PRN

## 2016-03-28 MED ORDER — HYDROXYZINE HCL 25 MG PO TABS: 25.0000 mg | ORAL_TABLET | Freq: Four times a day (QID) | ORAL | 0 refills | Status: DC | PRN

## 2016-03-28 MED ORDER — WARFARIN SODIUM 7.5 MG PO TABS: 15.0000 mg | ORAL_TABLET | Freq: Once | ORAL | 0 refills | Status: DC

## 2016-03-28 NOTE — Progress Notes (Signed)
Recreation Therapy Notes  Animal-Assisted Activity (AAA) Program Checklist/Progress Notes Patient Eligibility Criteria Checklist & Daily Group note for Rec TxIntervention  Date: 01.23.2018 Time: 2:50pm Location: [address] Dayroom    AAA/T Program Assumption of Risk Form signed by Patient/ or Parent Legal Guardian Yes  Patient is free of allergies or sever asthma Yes  Patient reports no fear of animals Yes  Patient reports no history of cruelty to animals Yes  Patient understands his/her participation is voluntary Yes  Patient washes hands before animal contact Yes  Patient washes hands after animal contact Yes  Behavioral Response: Appropriate   Education:Hand Washing, Appropriate Animal Interaction   Education Outcome: Acknowledges education.   Clinical Observations/Feedback: Patient attended session and interacted appropriately with therapy dog and peers.   Anna Butler Anna Butler, LRT/CTRS           Anna Butler 03/28/2016 3:10 PM 

## 2016-03-28 NOTE — Progress Notes (Signed)
  [facility] Discharge [facility] :  Will you be returning to the same living situation after discharge:  Yes,  Pt returning home At discharge, do you have transportation home?: Yes,  Pt family to pick up Do you have the ability to pay for your medications: Yes,  Pt provided with prescriptions  Release of information consent forms completed and in the chart;  Patient's signature needed at discharge.  Patient to Follow up at: Follow-up Information    [facility] Follow up.   Why:  1/31 at 3:00pm for [facility]. 2/14 at 12:15pm for medication management with Dr. Lenoria FarrierAkers.  Contact information: [address] [place] [address] [phone]          Next level of care provider has access to [facility] Link:no  Safety Planning and Suicide Prevention discussed: Yes,  with mother; see SPE note  Have you used any form of tobacco in the last 30 days? (Cigarettes, Smokeless Tobacco, Cigars, and/or Pipes): No  Has patient been referred to the Quitline?: N/A patient is not a smoker  Patient has been referred for addiction treatment: Yes  Verdene LennertLauren C Dewaine Morocho 03/28/2016, 11:54 AM

## 2016-03-28 NOTE — [facility] Suicide Risk Assessment (Signed)
[facility] Discharge Suicide Risk Assessment   Principal Problem: Major depressive disorder, recurrent severe without psychotic features [facility]) Discharge Diagnoses:  Patient Active Problem List   Diagnosis Date Noted  . Major depressive disorder, recurrent severe without psychotic features (HCC) [F33.2] 03/24/2016  . Major depressive disorder, recurrent episode (HCC) [F33.9] 03/23/2016  . Pilonidal abscess [L05.01] 03/27/2012  . Long term current use of anticoagulant therapy [Z79.01] 04/15/2010  . SHORTNESS OF BREATH [R06.02] 02/01/2010  . ACUTE KIDNEY FAILURE UNSPECIFIED [N17.9] 01/06/2010  . PNEUMONIA DUE TO OTHER SPECIFIED BACTERIA [J15.8] 12/15/2009  . Acute thromboembolism of deep veins of lower extremity (HCC) [I82.409] 12/13/2009    Total Time spent with patient: 30 minutes  Musculoskeletal: Strength & Muscle Tone: within normal limits Gait & Station: normal Patient leans: N/A  Psychiatric Specialty Exam: ROS denies chest pain, no shortness of breath, no vomiting, no epistaxis   Blood pressure 114/61, pulse 98, temperature 98.8 F (37.1 C), temperature source Oral, resp. rate 18, height 5\' 3"  (1.6 m), weight 111 kg (244 lb 11.4 oz), SpO2 98 %.Body mass index is 43.35 kg/m.  General Appearance: improved grooming   Eye Contact::  Good  Speech:  Normal Rate409  Volume:  Normal  Mood:  improved, states she feels better and currently minimizes depression   Affect:  Appropriate and reactive, smiles at times appropriately   Thought Process:  Linear  Orientation:  Full (Time, Place, and Person)  Thought Content:  denies hallucinations, no delusions, not internally preoccupied   Suicidal Thoughts:  No denies any suicidal or self injurious ideations, denies any homicidal or violent ideations   Homicidal Thoughts:  No  Memory:  recent and remote grossly intact   Judgement:  Other:  improving   Insight:  improving   Psychomotor Activity:  Normal  Concentration:  Good  Recall:  Good   Fund of Knowledge:Good  Language: Good  Akathisia:  Negative  Handed:  Right  AIMS (if indicated):     Assets:  Communication Skills Desire for Improvement Resilience  Sleep:  Number of Hours: 6.5  Cognition: WNL  ADL's:  Intact   Mental Status Per Nursing Assessment::   On Admission:  Suicidal ideation indicated by patient, Self-harm thoughts  Demographic Factors:  21 year old single female, college student   Loss Factors:   Historical Factors: History of depression   Risk Reduction Factors:   Sense of responsibility to family, Living with another person, especially a relative, Positive social support, Positive coping skills or problem solving skills and invested in college/ further education  Continued Clinical Symptoms:  At this time patient is improved, and presents better groomed, with good eye contact, speech normal, mood improved , affect more reactive, no thought disorder, no suicidal or self injurious ideations, no homicidal or violent ideations, no hallucinations, no delusions, not internally preoccupied, future oriented . Denies any medication side effects. We have reviewed medication side effects- at this time patient is not endorsing or presenting with any Venlafaxine withdrawal symptoms. She is tolerating Wellbutrin SR well . She plans to continue to taper off Venlafaxine under the guidance of her outpatient clinician .  Patient aware of potential increased risk of bleeding from Coumadin management, which she is on for history of antiphospholipid Syndrome. As  Noted, denies any current bleeding . Behavior on unit has remained calm, in good control. Staff has spoken with mother , who has corroborated clinical improvement since admission  and support for patient .    Cognitive Features That Contribute  To Risk:  No gross cognitive deficits noted upon discharge. Is alert , attentive, and oriented x 3   Suicide Risk:  Mild:  Suicidal ideation of limited frequency,  intensity, duration, and specificity.  There are no identifiable plans, no associated intent, mild dysphoria and related symptoms, good self-control (both objective and subjective assessment), few other risk factors, and identifiable protective factors, including available and accessible social support.  Follow-up Information    [facility] Follow up.   Why:  1/31 at 3:00pm for your intitial [facility]. 2/14 at 12:15pm for medication management with Dr. Lenoria FarrierAkers.  Contact information: [address] [place] [address] [phone]          Plan Of Care/Follow-up recommendations:  Activity:  as tolerated  Diet:  regular Tests:  NA Other:  see below  Patient is requesting discharge and there are no current grounds for involuntary commitment  She is leaving unit in good spirits  She plans to return home  Plans to follow up as above  Continue Coumadin management with her PCP .   Nehemiah MassedOBOS, Makinley Muscato, MD 03/28/2016, 12:37 PM

## 2016-03-28 NOTE — Progress Notes (Signed)
Patient discharged per physician order; patient denies SI/HI and A/V hallucinations; patient received prescriptions,  AVS,suicide risk assessment note, and transition record given to the patient after it was reviewed; patient had no other questions or concerns at this time; patient verbalized and signed that all belongings were returned; patient left the unit ambulatory 

## 2016-03-28 NOTE — [facility] Group Notes (Signed)
[facility] Group Notes:  (Nursing/MHT/Case Management/Adjunct)  Date:  03/28/2016  Time:  0900 am  Type of Therapy:  Mindfulness/Living in the Moment  Participation Level:  Active  Participation Quality:  Appropriate and Attentive  Affect:  Appropriate  Cognitive:  Alert and Appropriate  Insight:  Good  Engagement in Group:  Engaged  Modes of Intervention:  Exploration  Summary of Progress/Problems:  Patient participated in the exercises.  She stated that she liked the mindfulness exercise.  Patient is hoping for discharge today.  Cranford MonBeaudry, Janeane Cozart Evans 03/28/2016, 10:06 AM

## 2016-03-28 NOTE — Tx Team (Signed)
Interdisciplinary Treatment and Diagnostic Plan Update  03/28/2016 Time of Session: 11:49 AM  Anna Butler MRN: [id number]  Principal Diagnosis: Major depressive disorder, recurrent severe without psychotic features (HCC)  Secondary Diagnoses: Principal Problem:   Major depressive disorder, recurrent severe without psychotic features (HCC)   Current Medications:  Current Facility-Administered Medications  Medication Dose Route Frequency Provider Last Rate Last Dose  . acetaminophen (TYLENOL) tablet 650 mg  650 mg Oral Q6H PRN Charm RingsJamison Y Lord, NP      . alum & mag hydroxide-simeth (MAALOX/MYLANTA) 200-200-20 MG/5ML suspension 30 mL  30 mL Oral Q4H PRN Charm RingsJamison Y Lord, NP      . buPROPion [employer](WELLBUTRIN SR) 12 hr tablet 150 mg  150 mg Oral Daily Craige CottaFernando A Cobos, MD   150 mg at 03/28/16 0824  . hydrOXYzine (ATARAX/VISTARIL) tablet 25 mg  25 mg Oral Q6H PRN Adonis BrookSheila Agustin, NP   25 mg at 03/25/16 1814  . magnesium hydroxide (MILK OF MAGNESIA) suspension 30 mL  30 mL Oral Daily PRN Charm RingsJamison Y Lord, NP      . traZODone (DESYREL) tablet 50 mg  50 mg Oral QHS PRN Craige CottaFernando A Cobos, MD   50 mg at 03/27/16 2306  . venlafaxine XR (EFFEXOR-XR) 24 hr capsule 37.5 mg  37.5 mg Oral Daily Rockey SituFernando A Cobos, MD   37.5 mg at 03/28/16 0824  . warfarin (COUMADIN) tablet 15 mg  15 mg Oral ONCE-1800 Craige CottaFernando A Cobos, MD      . Warfarin - Pharmacist Dosing Inpatient   Does not apply q1800 Charm RingsJamison Y Lord, NP        PTA Medications: Prescriptions Prior to Admission  Medication Sig Dispense Refill Last Dose  . venlafaxine XR (EFFEXOR-XR) 75 MG 24 hr capsule Take 1 capsule (75 mg total) by mouth daily. (Patient taking differently: Take 150 mg by mouth daily. ) 90 capsule 1 03/22/2016 at Unknown time  . warfarin (COUMADIN) 5 MG tablet Take 5 mg 2 pills daily or AD by PCP. (Patient taking differently: Take 5 mg by mouth. Take 10 MG on Sunday, Tuesday, Thursday and Saturday, Take 7.5 MG on Monday, Wednesday and Friday)  90 tablet 0 03/22/2016 at 2200    Treatment Modalities: Medication Management, Group therapy, Case management,  1 to 1 session with clinician, Psychoeducation, Recreational therapy.  Patient Stressors: Civil Service fast streamerducational concerns Financial difficulties Health problems Marital or family conflict  Patient Strengths: Ability for insight Average or above average intelligence Capable of independent living Wellsite geologistCommunication skills General fund of knowledge Motivation for treatment/growth Supportive family/friends  Physician Treatment Plan for Primary Diagnosis: Major depressive disorder, recurrent severe without psychotic features (HCC) Long Term Goal(s): Improvement in symptoms so as ready for discharge  Short Term Goals: Ability to identify changes in lifestyle to reduce recurrence of condition will improve Ability to verbalize feelings will improve Ability to disclose and discuss suicidal ideas Ability to demonstrate self-control will improve Ability to identify and develop effective coping behaviors will improve Ability to maintain clinical measurements within normal limits will improve Compliance with prescribed medications will improve Ability to identify triggers associated with substance abuse/mental health issues will improve  Medication Management: Evaluate patient's response, side effects, and tolerance of medication regimen.  Therapeutic Interventions: 1 to 1 sessions, Unit Group sessions and Medication administration.  Evaluation of Outcomes: Adequate for Discharge  Physician Treatment Plan for Secondary Diagnosis: Principal Problem:   Major depressive disorder, recurrent severe without psychotic features (HCC)   Long Term Goal(s): Improvement in symptoms so  as ready for discharge  Short Term Goals: Ability to identify changes in lifestyle to reduce recurrence of condition will improve Ability to verbalize feelings will improve Ability to disclose and discuss suicidal  ideas Ability to demonstrate self-control will improve Ability to identify and develop effective coping behaviors will improve Ability to maintain clinical measurements within normal limits will improve Compliance with prescribed medications will improve Ability to identify triggers associated with substance abuse/mental health issues will improve  Medication Management: Evaluate patient's response, side effects, and tolerance of medication regimen.  Therapeutic Interventions: 1 to 1 sessions, Unit Group sessions and Medication administration.  Evaluation of Outcomes: Adequate for Discharge   RN Treatment Plan for Primary Diagnosis: Major depressive disorder, recurrent severe without psychotic features (HCC) Long Term Goal(s): Knowledge of disease and therapeutic regimen to maintain health will improve  Short Term Goals: Ability to verbalize feelings will improve and Ability to identify and develop effective coping behaviors will improve  Medication Management: RN will administer medications as ordered by provider, will assess and evaluate patient's response and provide education to patient for prescribed medication. RN will report any adverse and/or side effects to prescribing provider.  Therapeutic Interventions: 1 on 1 counseling sessions, Psychoeducation, Medication administration, Evaluate responses to treatment, Monitor vital signs and CBGs as ordered, Perform/monitor CIWA, COWS, AIMS and Fall Risk screenings as ordered, Perform wound care treatments as ordered.  Evaluation of Outcomes: Adequate for Discharge   LCSW Treatment Plan for Primary Diagnosis: Major depressive disorder, recurrent severe without psychotic features (HCC) Long Term Goal(s): Safe transition to appropriate next level of care at discharge, Engage patient in therapeutic group addressing interpersonal concerns.  Short Term Goals: Engage patient in aftercare planning with referrals and resources and Increase  skills for wellness and recovery  Therapeutic Interventions: Assess for all discharge needs, 1 to 1 time with Social worker, Explore available resources and support systems, Assess for adequacy in community support network, Educate family and significant other(s) on suicide prevention, Complete Psychosocial Assessment, Interpersonal group therapy.  Evaluation of Outcomes: Adequate for Discharge   Progress in Treatment: Attending groups: Yes Participating in groups: Yes Taking medication as prescribed: Yes, MD continues to assess for medication changes as needed Toleration medication: Yes, no side effects reported at this time Family/Significant other contact made: Yes with mother Patient understands diagnosis: Developing insight  Discussing patient identified problems/goals with staff: Yes Medical problems stabilized or resolved: Yes Denies suicidal/homicidal ideation: Yes Issues/concerns per patient self-inventory: None Other: N/A  New problem(s) identified: None identified at this time.   New Short Term/Long Term Goal(s): None identified at this time.   Discharge Plan or Barriers: Pt will return home and follow-up with outpatient services.   Reason for Continuation of Hospitalization: None identified at this time.   Estimated Length of Stay: 0 days  Attendees: Patient: 03/28/2016  11:49 AM  Physician: Dr. Jama Flavors 03/28/2016  11:49 AM  Nursing: Joslyn Devon, Liborio Nixon, RN 03/28/2016  11:49 AM  RN Care Manager: Onnie Boer, RN 03/28/2016  11:49 AM  Social Worker: Vernie Shanks, LCSW; Heather Smart, LCSW 03/28/2016  11:49 AM  Recreational Therapist:  03/28/2016  11:49 AM  Other: Armandina Stammer, NP; Gray Bernhardt, NP 03/28/2016  11:49 AM  Other:  03/28/2016  11:49 AM  Other: 03/28/2016  11:49 AM    Scribe for Treatment Team: Verdene Lennert, LCSW 03/28/2016 11:49 AM

## 2016-03-28 NOTE — Discharge Summary (Signed)
Physician Discharge Summary Note  Patient:  Anna Butler is an 21 y.o., female MRN:  [id number] DOB:  1995-07-20 Patient phone:  [phone] (home)  Patient address:   [address] [place] [address],  Total Time spent with patient: 30 minutes  Date of Admission:  03/24/2016 Date of Discharge: 03/28/2016  Reason for Admission:  overdose  Principal Problem: Major depressive disorder, recurrent severe without psychotic features [facility]) Discharge Diagnoses: Patient Active Problem List   Diagnosis Date Noted  . Major depressive disorder, recurrent severe without psychotic features (HCC) [F33.2] 03/24/2016  . Major depressive disorder, recurrent episode (HCC) [F33.9] 03/23/2016  . Pilonidal abscess [L05.01] 03/27/2012  . Long term current use of anticoagulant therapy [Z79.01] 04/15/2010  . SHORTNESS OF BREATH [R06.02] 02/01/2010  . ACUTE KIDNEY FAILURE UNSPECIFIED [N17.9] 01/06/2010  . PNEUMONIA DUE TO OTHER SPECIFIED BACTERIA [J15.8] 12/15/2009  . Acute thromboembolism of deep veins of lower extremity (HCC) [I82.409] 12/13/2009    Past Psychiatric History:  See HPI  Past Medical History:  Past Medical History:  Diagnosis Date  . ADD (attention deficit disorder)   . Anemia   . Clotting disorder (HCC)   . Depression   . DVT (deep venous thrombosis) [facility]) Oct 2011  . Dyspnea Oct 2011  . Lupus anticoagulant with hypercoagulable state (HCC) 2011  . Migraines   . Renal failure Oct 2011  . Scoliosis   . Sickle cell trait [facility])     Past Surgical History:  Procedure Laterality Date  . INCISE AND DRAIN ABCESS     buttock abscess   Family History:  Family History  Problem Relation Age of Onset  . Obesity Mother   . Hypertension Mother   . Cancer Maternal Grandfather     lung  . Heart disease Maternal Grandfather    Family Psychiatric  History:  See HPI Social History:  History  Alcohol Use No     History  Drug Use No    Social History   Social History   . Marital status: Single    Spouse name: N/A  . Number of children: N/A  . Years of education: N/A   Social History Main Topics  . Smoking status: Never Smoker  . Smokeless tobacco: Never Used  . Alcohol use No  . Drug use: No  . Sexual activity: Not Asked   Other Topics Concern  . None   Social History Narrative   Lives with Caniyah Murley here in [place]   Dad lives out of state   Likes drama class best at school [employer] 10th grade   Good student only missed 2 days in 2010   Hospital Course:  Anna Butler is an 21 y.o. female., African American who presented to ED per ED report: history of antiphospholipid syndrome, on chronic Coumadin, depression, and sickle cell trait presents to the emergency department after reported overdose.    Anna Butler was admitted for Major depressive disorder, recurrent severe without psychotic features (HCC) and crisis management.  Patient was treated with medications with their indications listed below in detail under Medication List.  Medical problems were identified and treated as needed.  Home medications were restarted as appropriate.  Improvement was monitored by observation and Anna Butler daily report of symptom reduction.  Emotional and mental status was monitored by daily self inventory reports completed by Anna Butler and clinical staff.  Patient reported continued improvement, denied any new concerns.  Patient had been compliant on medications  and denied side effects.  Support and encouragement was provided.         Anna Butler was evaluated by the treatment team for stability and plans for continued recovery upon discharge.  Patient was offered further treatment options upon discharge including Residential, Intensive Outpatient and Outpatient treatment. Patient will follow up with agency listed below for medication management and counseling.  Encouraged patient to maintain satisfactory support network  and home environment.  Advised to adhere to medication compliance and outpatient treatment follow up.  Prescriptions provided.       Anna Butler motivation was an integral factor for scheduling further treatment.  Employment, transportation, bed availability, health status, family support, and any pending legal issues were also considered during patient's hospital stay.  Upon completion of this admission the patient was both mentally and medically stable for discharge denying suicidal/homicidal ideation, auditory/visual/tactile hallucinations, delusional thoughts and paranoia.      Physical Findings: AIMS: Facial and Oral Movements Muscles of Facial Expression: None, normal Lips and Perioral Area: None, normal Jaw: None, normal Tongue: None, normal,Extremity Movements Upper (arms, wrists, hands, fingers): None, normal Lower (legs, knees, ankles, toes): None, normal, Trunk Movements Neck, shoulders, hips: None, normal, Overall Severity Severity of abnormal movements (highest score from questions above): None, normal Incapacitation due to abnormal movements: None, normal Patient's awareness of abnormal movements (rate only patient's report): No Awareness, Dental Status Current problems with teeth and/or dentures?: No Does patient usually wear dentures?: No  CIWA:    COWS:     Musculoskeletal: Strength & Muscle Tone: within normal limits Gait & Station: normal Patient leans: N/A  Psychiatric Specialty Exam:  See MD SRA Physical Exam  Nursing note and vitals reviewed.   ROS  Blood pressure 114/61, pulse 98, temperature 98.8 F (37.1 C), temperature source Oral, resp. rate 18, height 5\' 3"  (1.6 m), weight 111 kg (244 lb 11.4 oz), SpO2 98 %.Body mass index is 43.35 kg/m.   Have you used any form of tobacco in the last 30 days? (Cigarettes, Smokeless Tobacco, Cigars, and/or Pipes): No  Has this patient used any form of tobacco in the last 30 days? (Cigarettes, Smokeless Tobacco,  Cigars, and/or Pipes) Yes, N/A  Blood Alcohol level:  Lab Results  Component Value Date   ETH <5 03/23/2016    Metabolic Disorder Labs:  Lab Results  Component Value Date   HGBA1C 5.3 11/11/2015   MPG 105 11/11/2015   MPG 103 08/19/2014   No results found for: PROLACTIN Lab Results  Component Value Date   CHOL 192 11/11/2015   TRIG 92 11/11/2015   HDL 41 (L) 11/11/2015   CHOLHDL 4.7 11/11/2015   VLDL 18 11/11/2015   LDLCALC 133 (H) 11/11/2015   LDLCALC 93 08/19/2014    See Psychiatric Specialty Exam and Suicide Risk Assessment completed by Attending Physician prior to discharge.  Discharge destination:  Home  Is patient on multiple antipsychotic therapies at discharge:  No   Has Patient had three or more failed trials of antipsychotic monotherapy by history:  No  Recommended Plan for Multiple Antipsychotic Therapies: NA   Allergies as of 03/28/2016   No Known Allergies     Medication List    TAKE these medications     Indication  buPROPion 150 MG 12 hr tablet Commonly known as:  WELLBUTRIN SR Take 1 tablet (150 mg total) by mouth daily. Start taking on:  03/29/2016  Indication:  Major Depressive Disorder   hydrOXYzine 25 MG tablet Commonly  known as:  ATARAX/VISTARIL Take 1 tablet (25 mg total) by mouth every 6 (six) hours as needed for anxiety.  Indication:  Anxiety Neurosis   traZODone 50 MG tablet Commonly known as:  DESYREL Take 1 tablet (50 mg total) by mouth at bedtime as needed for sleep.  Indication:  Major Depressive Disorder   venlafaxine XR 37.5 MG 24 hr capsule Commonly known as:  EFFEXOR-XR Take 1 capsule (37.5 mg total) by mouth daily. Start taking on:  03/29/2016 What changed:  medication strength  how much to take  Indication:  Generalized Anxiety Disorder   warfarin 7.5 MG tablet Commonly known as:  COUMADIN Take 2 tablets (15 mg total) by mouth one time only at 6 PM. What changed:  medication strength  how much to  take  how to take this  when to take this  additional instructions  Indication:  Thromboembolism      Follow-up Information    [facility] Follow up.   Why:  1/31 at 3:00pm for your [facility]ial [facility]. 2/14 at 12:15pm for medication management with Dr. Lenoria FarrierAkers.  Contact information: [address] [place] [address] [phone]         Follow-up recommendations:  Activity:  as tol Diet:  as tol  Comments:  1.  Take all your medications as prescribed.   2.  Report any adverse side effects to outpatient provider. 3.  Patient instructed to not use alcohol or illegal drugs while on prescription medicines. 4.  In the event of worsening symptoms, instructed patient to call 911, the crisis hotline or go to nearest emergency room for evaluation of symptoms.  Signed: Lindwood QuaSheila May Agustin, NP [facility] 03/28/2016, 11:13 AM   Patient seen, Suicide Assessment Completed.  Disposition Plan Reviewed

## 2016-04-07 ENCOUNTER — Encounter: Payer: Self-pay | Admitting: Internal Medicine

## 2016-04-10 ENCOUNTER — Encounter: Payer: Self-pay | Admitting: Internal Medicine

## 2016-04-19 ENCOUNTER — Encounter: Payer: Self-pay | Admitting: Internal Medicine

## 2016-04-19 ENCOUNTER — Ambulatory Visit (INDEPENDENT_AMBULATORY_CARE_PROVIDER_SITE_OTHER): Payer: BLUE CROSS/BLUE SHIELD | Admitting: Internal Medicine

## 2016-04-19 VITALS — BP 124/84 | HR 98 | Temp 98.0°F | Resp 16 | Ht 64.75 in | Wt 234.0 lb

## 2016-04-19 DIAGNOSIS — E559 Vitamin D deficiency, unspecified: Secondary | ICD-10-CM

## 2016-04-19 DIAGNOSIS — Z1389 Encounter for screening for other disorder: Secondary | ICD-10-CM

## 2016-04-19 DIAGNOSIS — Z79899 Other long term (current) drug therapy: Secondary | ICD-10-CM | POA: Diagnosis not present

## 2016-04-19 DIAGNOSIS — Z13220 Encounter for screening for lipoid disorders: Secondary | ICD-10-CM

## 2016-04-19 DIAGNOSIS — Z7901 Long term (current) use of anticoagulants: Secondary | ICD-10-CM | POA: Diagnosis not present

## 2016-04-19 DIAGNOSIS — F332 Major depressive disorder, recurrent severe without psychotic features: Secondary | ICD-10-CM

## 2016-04-19 DIAGNOSIS — Z130 Encounter for screening for diseases of the blood and blood-forming organs and certain disorders involving the immune mechanism: Secondary | ICD-10-CM

## 2016-04-19 DIAGNOSIS — Z1329 Encounter for screening for other suspected endocrine disorder: Secondary | ICD-10-CM

## 2016-04-19 DIAGNOSIS — Z131 Encounter for screening for diabetes mellitus: Secondary | ICD-10-CM

## 2016-04-19 DIAGNOSIS — Z0000 Encounter for general adult medical examination without abnormal findings: Secondary | ICD-10-CM | POA: Diagnosis not present

## 2016-04-19 LAB — CBC WITH DIFFERENTIAL/PLATELET
BASOS ABS: 0 {cells}/uL (ref 0–200)
Basophils Relative: 0 %
Eosinophils Absolute: 44 cells/uL (ref 15–500)
Eosinophils Relative: 1 %
HEMATOCRIT: 40 % (ref 35.0–45.0)
Hemoglobin: 13.5 g/dL (ref 11.7–15.5)
LYMPHS ABS: 1540 {cells}/uL (ref 850–3900)
Lymphocytes Relative: 35 %
MCH: 27.3 pg (ref 27.0–33.0)
MCHC: 33.8 g/dL (ref 32.0–36.0)
MCV: 81 fL (ref 80.0–100.0)
MONO ABS: 484 {cells}/uL (ref 200–950)
MPV: 8.9 fL (ref 7.5–12.5)
Monocytes Relative: 11 %
NEUTROS PCT: 53 %
Neutro Abs: 2332 cells/uL (ref 1500–7800)
Platelets: 395 10*3/uL (ref 140–400)
RBC: 4.94 MIL/uL (ref 3.80–5.10)
RDW: 15.5 % — ABNORMAL HIGH (ref 11.0–15.0)
WBC: 4.4 10*3/uL (ref 3.8–10.8)

## 2016-04-19 NOTE — Progress Notes (Signed)
Annual Screening Comprehensive Examination   This very nice 21 y.o.female presents for complete physical.  Patient has no major health issues.  Patient reports no complaints at this time.   She is currently on coumadin.  She reports that recently she has been running on the thicker end.  She is doing 7.5 mg and then 10 mg alternating.  She reports that she is taking the medication with no missed doses.  She reports that she is having lower INR.  She is not interested in eliquis or xarelto at this time.    She is not having any issues with school.  She is getting good grades.  She has no trouble with focus or turing assignments in on time.    She is seeing psychiatry.  She is doing better with medications.  She still feels like she has some fluctuations o f her mood.  She likes her therapist.  She is able to talk safely there.  [place]    Finally, patient has history of Vitamin D Deficiency and last vitamin D was  Lab Results  Component Value Date   VD25OH 12 (L) 08/19/2014  .  Currently on supplementation  She has very irregular periods.  She reports that it does fluctuate a lot.  She reports that she is never sure when she will have it.  She reports that she does see obgyn. She is due to see them soon.  She is seeing [facility].     Current Outpatient Prescriptions on File Prior to Visit  Medication Sig Dispense Refill  . buPROPion (WELLBUTRIN SR) 150 MG 12 hr tablet Take 1 tablet (150 mg total) by mouth daily. 30 tablet 0  . hydrOXYzine (ATARAX/VISTARIL) 25 MG tablet Take 1 tablet (25 mg total) by mouth every 6 (six) hours as needed for anxiety. 30 tablet 0  . traZODone (DESYREL) 50 MG tablet Take 1 tablet (50 mg total) by mouth at bedtime as needed for sleep. 30 tablet 0  . venlafaxine XR (EFFEXOR-XR) 37.5 MG 24 hr capsule Take 1 capsule (37.5 mg total) by mouth daily. 30 capsule 0  . warfarin (COUMADIN) 7.5 MG tablet Take 2  tablets (15 mg total) by mouth one time only at 6 PM. 60 tablet 0   No current facility-administered medications on file prior to visit.     No Known Allergies  Past Medical History:  Diagnosis Date  . ADD (attention deficit disorder)   . Anemia   . Clotting disorder (HCC)   . Depression   . DVT (deep venous thrombosis) [facility]) Oct 2011  . Dyspnea Oct 2011  . Lupus anticoagulant with hypercoagulable state (HCC) 2011  . Migraines   . Renal failure Oct 2011  . Scoliosis   . Sickle cell trait (HCC)     Immunization History  Administered Date(s) Administered  . Tdap 03/06/2010    Past Surgical History:  Procedure Laterality Date  . INCISE AND DRAIN ABCESS     buttock abscess    Family History  Problem Relation Age of Onset  . Obesity Mother   . Hypertension Mother   . Cancer Maternal Grandfather     lung  . Heart disease Maternal Grandfather     Social History   Social History  . Marital status: Single    Spouse name: N/A  . Number of children: N/A  . Years of education: N/A   Occupational History  . Not  on file.   Social History Main Topics  . Smoking status: Never Smoker  . Smokeless tobacco: Never Used  . Alcohol use No  . Drug use: No  . Sexual activity: Not on file   Other Topics Concern  . Not on file   Social History Narrative   Lives with Anna Butler here in [place]   Dad lives out of state   Likes drama class best at [employer] 10th grade   Good student only missed 2 days in 2010    Review of Systems  Constitutional: Negative for chills, fever and malaise/fatigue.  HENT: Negative for congestion, ear pain, hearing loss and sore throat.   Eyes: Negative for blurred vision, double vision, pain, discharge and redness.  Respiratory: Negative for cough, shortness of breath and wheezing.   Cardiovascular: Negative for chest pain, palpitations and leg swelling.  Gastrointestinal: Negative for abdominal pain, blood in stool,  constipation, diarrhea, heartburn, melena and vomiting.  Genitourinary: Negative.   Skin: Negative for itching and rash.  Neurological: Negative for dizziness, sensory change and loss of consciousness.  Psychiatric/Behavioral: Positive for depression. The patient is not nervous/anxious and does not have insomnia.       Physical Exam  BP 124/84   Pulse 98   Temp 98 F (36.7 C) (Temporal)   Resp 16   Ht 5' 4.75" (1.645 m)   Wt 234 lb (106.1 kg)   LMP 04/02/2016 (Approximate)   BMI 39.24 kg/m   General Appearance: Well nourished and in no apparent distress. Eyes: PERRLA, EOMs, conjunctiva no swelling or erythema, normal fundi and vessels. Sinuses: No frontal/maxillary tenderness ENT/Mouth: EACs patent / TMs  nl. Nares clear without erythema, swelling, mucoid exudates. Oral hygiene is good. No erythema, swelling, or exudate. Tongue normal, non-obstructing. Tonsils not swollen or erythematous. Hearing normal.  Neck: Supple, thyroid normal. No bruits, nodes or JVD. Respiratory: Respiratory effort normal.  BS equal and clear bilateral without rales, rhonci, wheezing or stridor. Cardio: Heart sounds are normal with regular rate and rhythm and no murmurs, rubs or gallops. Peripheral pulses are normal and equal bilaterally without edema. No aortic or femoral bruits. Chest: symmetric with normal excursions and percussion. Breasts: Deferred to Gyn Abdomen: Flat, soft, with bowl sounds. Nontender, no guarding, rebound, hernias, masses, or organomegaly.  Lymphatics: Non tender without lymphadenopathy.  Genitourinary: Deferred to Gyn Musculoskeletal: Full ROM all peripheral extremities, joint stability, 5/5 strength, and normal gait. Skin: Warm and dry without rashes, lesions, cyanosis, clubbing or  ecchymosis.  Neuro: Cranial nerves intact, reflexes equal bilaterally. Normal muscle tone, no cerebellar symptoms. Sensation intact.  Pysch: Awake and oriented X 3, normal affect, Insight and  Judgment appropriate.   Assessment and Plan   1. Routine general medical examination at a health care facility -due next year  2. Screening for hyperlipidemia  - Lipid panel  3. Screening for diabetes mellitus  - Hemoglobin A1c - Insulin, random  4. Screening for deficiency anemia  - Iron and TIBC - Vitamin B12  5. Screening for hematuria or proteinuria  - Urinalysis, Routine w reflex microscopic - Microalbumin / creatinine urine ratio  6. Vitamin D deficiency -needs to start supplement - VITAMIN D 25 Hydroxy (Vit-D Deficiency, Fractures)  7. Screening for thyroid disorder  - TSH  8. Current use of long term anticoagulation -secondary to recurrent PE/DVT -will need life long anticoagulation -have suggessted using xarelto or eliquis but patient refuses -dose adjust if necessary - Protime-INR  9. Medication management  -  CBC with Differential/Platelet - BASIC METABOLIC PANEL WITH GFR - Hepatic function panel - Magnesium   10.  Major Depressive Disorder Recurrent, severe -followed by psychiatry and a counselor -cont current medication routine   Continue prudent diet as discussed, weight control, regular exercise, and medications. Routine screening labs and tests as requested with regular follow-up as recommended.  Over 40 minutes of exam, counseling, chart review and critical decision making was performed

## 2016-04-20 ENCOUNTER — Encounter: Payer: Self-pay | Admitting: Internal Medicine

## 2016-04-20 LAB — LIPID PANEL
CHOLESTEROL: 190 mg/dL (ref <200)
HDL: 40 mg/dL — AB (ref >50)
LDL CALC: 132 mg/dL — AB (ref <100)
TRIGLYCERIDES: 89 mg/dL (ref <150)
Total CHOL/HDL Ratio: 4.8 Ratio (ref <5.0)
VLDL: 18 mg/dL (ref <30)

## 2016-04-20 LAB — URINALYSIS, MICROSCOPIC ONLY
Casts: NONE SEEN [LPF]
Crystals: NONE SEEN [HPF]
Yeast: NONE SEEN [HPF]

## 2016-04-20 LAB — URINALYSIS, ROUTINE W REFLEX MICROSCOPIC
Bilirubin Urine: NEGATIVE
GLUCOSE, UA: NEGATIVE
HGB URINE DIPSTICK: NEGATIVE
KETONES UR: NEGATIVE
NITRITE: NEGATIVE
PH: 7.5 (ref 5.0–8.0)
Protein, ur: NEGATIVE
Specific Gravity, Urine: 1.018 (ref 1.001–1.035)

## 2016-04-20 LAB — BASIC METABOLIC PANEL WITH GFR
BUN: 8 mg/dL (ref 7–25)
CO2: 22 mmol/L (ref 20–31)
Calcium: 9.4 mg/dL (ref 8.6–10.2)
Chloride: 104 mmol/L (ref 98–110)
Creat: 0.65 mg/dL (ref 0.50–1.10)
GLUCOSE: 85 mg/dL (ref 65–99)
POTASSIUM: 4.2 mmol/L (ref 3.5–5.3)
SODIUM: 138 mmol/L (ref 135–146)

## 2016-04-20 LAB — MAGNESIUM: Magnesium: 2 mg/dL (ref 1.5–2.5)

## 2016-04-20 LAB — MICROALBUMIN / CREATININE URINE RATIO
Creatinine, Urine: 135 mg/dL (ref 20–320)
Microalb Creat Ratio: 5 mcg/mg creat (ref <30)
Microalb, Ur: 0.7 mg/dL

## 2016-04-20 LAB — HEPATIC FUNCTION PANEL
ALK PHOS: 54 U/L (ref 33–115)
ALT: 23 U/L (ref 6–29)
AST: 19 U/L (ref 10–30)
Albumin: 4.6 g/dL (ref 3.6–5.1)
BILIRUBIN INDIRECT: 0.2 mg/dL (ref 0.2–1.2)
BILIRUBIN TOTAL: 0.3 mg/dL (ref 0.2–1.2)
Bilirubin, Direct: 0.1 mg/dL (ref <=0.2)
TOTAL PROTEIN: 7.6 g/dL (ref 6.1–8.1)

## 2016-04-20 LAB — IRON AND TIBC
%SAT: 15 % (ref 11–50)
IRON: 47 ug/dL (ref 40–190)
TIBC: 324 ug/dL (ref 250–450)
UIBC: 277 ug/dL (ref 125–400)

## 2016-04-20 LAB — INSULIN, RANDOM: INSULIN: 16.2 u[IU]/mL (ref 2.0–19.6)

## 2016-04-20 LAB — PROTIME-INR
INR: 2.6 — AB
PROTHROMBIN TIME: 26.4 s — AB (ref 9.0–11.5)

## 2016-04-20 LAB — TSH: TSH: 1.18 mIU/L

## 2016-04-20 LAB — VITAMIN B12: VITAMIN B 12: 730 pg/mL (ref 200–1100)

## 2016-04-20 LAB — VITAMIN D 25 HYDROXY (VIT D DEFICIENCY, FRACTURES): Vit D, 25-Hydroxy: 22 ng/mL — ABNORMAL LOW (ref 30–100)

## 2016-04-20 LAB — HEMOGLOBIN A1C
Hgb A1c MFr Bld: 5.2 % (ref <5.7)
Mean Plasma Glucose: 103 mg/dL

## 2016-04-21 ENCOUNTER — Other Ambulatory Visit: Payer: Self-pay | Admitting: Internal Medicine

## 2016-04-21 MED ORDER — ONDANSETRON 8 MG PO TBDP: ORAL_TABLET | ORAL | 0 refills | Status: DC

## 2016-04-24 ENCOUNTER — Other Ambulatory Visit: Payer: Self-pay | Admitting: Internal Medicine

## 2016-04-24 DIAGNOSIS — D6862 Lupus anticoagulant syndrome: Secondary | ICD-10-CM

## 2016-05-09 ENCOUNTER — Encounter: Payer: Self-pay | Admitting: *Deleted

## 2016-05-16 ENCOUNTER — Encounter: Payer: Self-pay | Admitting: Internal Medicine

## 2016-05-17 ENCOUNTER — Other Ambulatory Visit: Payer: Self-pay | Admitting: Internal Medicine

## 2016-05-17 MED ORDER — PHENTERMINE HCL 37.5 MG PO TABS: 37.5000 mg | ORAL_TABLET | Freq: Every day | ORAL | 2 refills | Status: DC

## 2016-05-19 ENCOUNTER — Encounter: Payer: Self-pay | Admitting: Internal Medicine

## 2016-05-31 ENCOUNTER — Other Ambulatory Visit: Payer: BLUE CROSS/BLUE SHIELD

## 2016-05-31 DIAGNOSIS — Z7901 Long term (current) use of anticoagulants: Secondary | ICD-10-CM

## 2016-06-01 LAB — PROTIME-INR
INR: 1.9 — AB
Prothrombin Time: 19.3 s — ABNORMAL HIGH (ref 9.0–11.5)

## 2016-06-23 ENCOUNTER — Telehealth: Payer: Self-pay

## 2016-06-23 NOTE — Telephone Encounter (Signed)
Pt called with questions about Wellbutrin increase & decreasing her effexor in order to start a new med    Per provider suggest still following up with psych & can try to add on Celexa s to Wellbutrin s.   Called the number provided by the pt & was unable to reach pt due to pt VM box not being set up. 20th April 2018 @ 10:30AM

## 2016-07-16 ENCOUNTER — Encounter: Payer: Self-pay | Admitting: Physician Assistant

## 2016-07-18 NOTE — Progress Notes (Signed)
Patient ID: Anna Butler, female   DOB: 1995-05-25, 21 y.o.   MRN: [id number]  Assessment and Plan:   Acute thromboembolism of deep veins of lower extremity, unspecified laterality (HCC) -     Protime-INR  Medication management -     Protime-INR  Morbid obesity due to excess calories (HCC) - long discussion about weight loss, diet, and exercise - will stop phentermine  Continue diet and meds as discussed. Further disposition pending results of labs. Future Appointments Date Time Provider Department Center  04/23/2017 3:00 PM Quentin Mulling, PA-C [facility] None    HPI 21 y.o. female  presents for 1 month follow up with long term coumadin use and would like an increase in her effexor.  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  She denies any easy bleeding, no nose bleeds, bruising, black stools, etc. No recent falls or ABX use.  10mg  4 days a week, 7.5 3 days a week.  Lab Results  Component Value Date   INR 1.9 (H) 05/31/2016   INR 2.6 (H) 04/19/2016   INR 1.89 03/28/2016   Anxiety is doing well, off effexor, on atarax PRN for anxiety.  BMI is Body mass index is 39.54 kg/m., she is working on diet and exercise. On phentermine but has not had any weight loss.  Wt Readings from Last 3 Encounters:  07/20/16 235 lb 12.8 oz (107 kg)  04/19/16 234 lb (106.1 kg)  12/15/15 239 lb 12.8 oz (108.8 kg)     Current Medications:  Current Outpatient Prescriptions on File Prior to Visit  Medication Sig Dispense Refill  . hydrOXYzine (ATARAX/VISTARIL) 25 MG tablet Take 1 tablet (25 mg total) by mouth every 6 (six) hours as needed for anxiety. 30 tablet 0  . phentermine (ADIPEX-P) 37.5 MG tablet Take 1 tablet (37.5 mg total) by mouth daily before breakfast. 30 tablet 2  . warfarin (COUMADIN) 5 MG tablet TAKE 2 TABLETS BY MOUTH DAILY OR AS DIRECTED BY PROVIDER 90 tablet 0  . warfarin (COUMADIN) 7.5 MG tablet Take 2 tablets (15 mg total) by mouth one time only at 6 PM. 60  tablet 0   No current facility-administered medications on file prior to visit.     Medical History:  Past Medical History:  Diagnosis Date  . ADD (attention deficit disorder)   . Anemia   . Clotting disorder (HCC)   . Depression   . DVT (deep venous thrombosis) [facility]) Oct 2011  . Dyspnea Oct 2011  . Lupus anticoagulant with hypercoagulable state (HCC) 2011  . Migraines   . Renal failure Oct 2011  . Scoliosis   . Sickle cell trait (HCC)     Allergies: No Known Allergies   Review of Systems:  Review of Systems  Constitutional: Negative for chills, fever and malaise/fatigue.  HENT: Negative for congestion, ear pain and sore throat.   Eyes: Negative.   Respiratory: Negative for cough, shortness of breath and wheezing.   Cardiovascular: Negative for chest pain, palpitations and leg swelling.  Gastrointestinal: Negative for blood in stool, constipation, diarrhea, heartburn and melena.  Genitourinary: Negative for dysuria, frequency, hematuria and urgency.  Musculoskeletal: Negative.   Neurological: Negative for dizziness, sensory change, loss of consciousness and headaches.  Psychiatric/Behavioral: Negative for depression. The patient is not nervous/anxious and does not have insomnia.     Family history- Review and unchanged  Social history- Review and unchanged  Physical Exam: BP 110/74   Pulse 90   Temp 98.2 F (36.8  C)   Resp 16   Ht 5' 4.75" (1.645 m)   Wt 235 lb 12.8 oz (107 kg)   LMP 06/06/2016   SpO2 98%   BMI 39.54 kg/m  Wt Readings from Last 3 Encounters:  07/20/16 235 lb 12.8 oz (107 kg)  04/19/16 234 lb (106.1 kg)  12/15/15 239 lb 12.8 oz (108.8 kg)    General Appearance: Well nourished well developed, in no apparent distress. Eyes: PERRLA, EOMs, conjunctiva no swelling or erythema ENT/Mouth: Ear canals normal without obstruction, swelling, erythma, discharge.  TMs normal bilaterally.  Oropharynx moist, clear, without exudate, or postoropharyngeal  swelling. Neck: Supple, thyroid normal,no cervical adenopathy  Respiratory: Respiratory effort normal, Breath sounds clear A&P without rhonchi, wheeze, or rale.  No retractions, no accessory usage. Cardio: RRR with no MRGs. Brisk peripheral pulses without edema.  Abdomen: Soft, + BS,  Non tender, no guarding, rebound, hernias, masses. Musculoskeletal: Full ROM, 5/5 strength, Normal gait Skin: Warm, dry without rashes, lesions, ecchymosis.  Neuro: Awake and oriented X 3, Cranial nerves intact. Normal muscle tone, no cerebellar symptoms. Psych: Normal affect, Insight and Judgment appropriate.    Quentin MullingAmanda Yun Gutierrez, PA-C 11:09 AM [facility]

## 2016-07-20 ENCOUNTER — Encounter: Payer: Self-pay | Admitting: Physician Assistant

## 2016-07-20 ENCOUNTER — Ambulatory Visit (INDEPENDENT_AMBULATORY_CARE_PROVIDER_SITE_OTHER): Payer: BLUE CROSS/BLUE SHIELD | Admitting: Physician Assistant

## 2016-07-20 VITALS — BP 110/74 | HR 90 | Temp 98.2°F | Resp 16 | Ht 64.75 in | Wt 235.8 lb

## 2016-07-20 DIAGNOSIS — I82409 Acute embolism and thrombosis of unspecified deep veins of unspecified lower extremity: Secondary | ICD-10-CM | POA: Diagnosis not present

## 2016-07-20 DIAGNOSIS — Z7901 Long term (current) use of anticoagulants: Secondary | ICD-10-CM

## 2016-07-20 DIAGNOSIS — F333 Major depressive disorder, recurrent, severe with psychotic symptoms: Secondary | ICD-10-CM | POA: Diagnosis not present

## 2016-07-20 DIAGNOSIS — D6862 Lupus anticoagulant syndrome: Secondary | ICD-10-CM

## 2016-07-20 MED ORDER — WARFARIN SODIUM 7.5 MG PO TABS: 15.0000 mg | ORAL_TABLET | Freq: Once | ORAL | 3 refills | Status: DC

## 2016-07-20 MED ORDER — WARFARIN SODIUM 5 MG PO TABS: ORAL_TABLET | ORAL | 0 refills | Status: DC

## 2016-07-20 NOTE — Patient Instructions (Addendum)
For weight loss want bare minimum is 80 oz ideally 120 oz Eat 3 meals a day Add in more veggies   Simple math prevails.    1st - exercise does not produce significant weight loss - at best one converts fat into muscle , "bulks up", loses inches, but usually stays "weight neutral"     2nd - think of your body weightas a check book: If you eat more calories than you burn up - you save money or gain weight .... Or if you spend more money than you put in the check book, ie burn up more calories than you eat, then you lose weight     3rd - if you walk or run 1 mile, you burn up 100 calories - you have to burn up 3,500 calories to lose 1 pound, ie you have to walk/run 35 miles to lose 1 measly pound. So if you want to lose 10 #, then you have to walk/run 350 miles, so.... clearly exercise is not the solution.     4. So if you consume 1,500 calories, then you have to burn up the equivalent of 15 miles to stay weight neutral - It also stands to reason that if you consume 1,500 cal/day and don't lose weight, then you must be burning up about 1,500 cals/day to stay weight neutral.     5. If you really want to lose weight, you must cut your calorie intake 300 calories /day and at that rate you should lose about 1 # every 3 days.   6. Please purchase Dr Francis DowseJoel Fuhrman's book(s) "The End of Dieting" & "Eat to Live" . It has some great concepts and recipes.     We want weight loss that will last so you should lose 1-2 pounds a week.  THAT IS IT! Please pick THREE things a month to change. Once it is a habit check off the item. Then pick another three items off the list to become habits.  If you are already doing a habit on the list GREAT!  Cross that item off! o Don't drink your calories. Ie, alcohol, soda, fruit juice, and sweet tea.  o Drink more water. Drink a glass when you feel hungry or before each meal.  o Eat breakfast - Complex carb and protein (likeDannon light and fit yogurt, oatmeal, fruit, eggs,  [place]key bacon). o Measure your cereal.  Eat no more than one cup a day. (ie [place]ashi) o Eat an apple a day. o Add a vegetable a day. o Try a new vegetable a month. o Use Pam! Stop using oil or butter to cook. o Don't finish your plate or use smaller plates. o Share your dessert. o Eat sugar free Jello for dessert or frozen grapes. o Don't eat 2-3 hours before bed. o Switch to whole wheat bread, pasta, and brown rice. o Make healthier choices when you eat out. No fries! o Pick baked chicken, NOT fried. o Don't forget to SLOW DOWN when you eat. It is not going anywhere.  o Take the stairs. o Park far away in the parking lot o State Farm[employer]ift soup cans (or weights) for 10 minutes while watching TV. o Walk at work for 10 minutes during break. o Walk outside 1 time a week with your friend, kids, dog, or significant other. o Start a walking group at church. o Walk the mall as much as you can tolerate.  o Keep a food diary. o Weigh yourself daily. o Walk for  15 minutes 3 days per week. o Cook at home more often and eat out less.  If life happens and you go back to old habits, it is okay.  Just start over. You can do it!   If you experience chest pain, get short of breath, or tired during the exercise, please stop immediately and inform your doctor.

## 2016-07-21 ENCOUNTER — Encounter: Payer: Self-pay | Admitting: Physician Assistant

## 2016-07-21 LAB — PROTIME-INR
INR: 4.6 — AB
Prothrombin Time: 45.8 s — ABNORMAL HIGH (ref 9.0–11.5)

## 2016-08-07 ENCOUNTER — Other Ambulatory Visit: Payer: Self-pay

## 2016-08-07 DIAGNOSIS — F411 Generalized anxiety disorder: Secondary | ICD-10-CM

## 2016-08-07 MED ORDER — VENLAFAXINE HCL ER 75 MG PO CP24: 75.0000 mg | ORAL_CAPSULE | Freq: Every day | ORAL | 1 refills | Status: DC

## 2016-08-31 ENCOUNTER — Encounter: Payer: Self-pay | Admitting: Physician Assistant

## 2016-09-01 ENCOUNTER — Other Ambulatory Visit: Payer: Self-pay | Admitting: Physician Assistant

## 2016-09-01 ENCOUNTER — Encounter: Payer: Self-pay | Admitting: Physician Assistant

## 2016-09-01 DIAGNOSIS — D6862 Lupus anticoagulant syndrome: Secondary | ICD-10-CM

## 2016-09-21 NOTE — Progress Notes (Signed)
Patient ID: Anna Butler, female   DOB: [birth year], 21 y.o.   MRN: [id number]  Assessment and Plan:   Acute thromboembolism of deep veins of lower extremity, unspecified laterality (HCC) -     Protime-INR  Medication management -     Protime-INR  Morbid obesity due to excess calories (HCC) - long discussion about weight loss, diet, and exercise - will stop phentermine  Plantar Faciitis/pes planus-  Conservative treatment, night time orthotics, arch support, RICE,  stretches given No NSAIDS due to coumadin use If not better will refer  Depression  Continue to see therapist Off meds  Continue diet and meds as discussed. Further disposition pending results of labs. Future Appointments Date Time Provider Department Center  11/02/2016 4:00 PM Quentin Mulling, [place] [facility] None  04/23/2017 3:00 PM Quentin Mulling, PA-C [facility] None    HPI 21 y.o. female  presents for 1 month follow up with long term coumadin use for history of DVT with + lupus anticoagulant.  She denies any easy bleeding, no nose bleeds, bruising etc. She did have blood/dark in her stool x 1 [place] at [employer], states had drinking night before.  No recent falls or ABX use. No GERD, no NSAIDS use.  She has been complaining of right heel pain, better with walking for a while, worse first thing in the morning and first walking.  10mg  43days a week, 7.5 4 days a week.  Lab Results  Component Value Date   INR 4.6 (H) 07/20/2016   INR 1.9 (H) 05/31/2016   INR 2.6 (H) 04/19/2016   BMI is Body mass index is 38.34 kg/m., she is working on diet and exercise.  Wt Readings from Last 3 Encounters:  09/22/16 228 lb 9.6 oz (103.7 kg)  07/20/16 235 lb 12.8 oz (107 kg)  04/19/16 234 lb (106.1 kg)    Current Medications:  Current Outpatient Prescriptions on File Prior to Visit  Medication Sig  . hydrOXYzine (ATARAX/VISTARIL) 25 MG tablet Take 1 tablet (25 mg total) by mouth every 6 (six) hours as needed for anxiety.   [name] venlafaxine XR (EFFEXOR-XR) 75 MG 24 hr capsule Take 1 capsule (75 mg total) by mouth daily.  [name] warfarin (COUMADIN) 5 MG tablet TAKE 2 TABLETS BY MOUTH DAILY OR AS DIRECTED BY PROVIDER  . warfarin (COUMADIN) 7.5 MG tablet Take 2 tablets (15 mg total) by mouth one time only at 6 PM.   No current facility-administered medications on file prior to visit.      Medical History:  Past Medical History:  Diagnosis Date  . ADD (attention deficit disorder)   . Anemia   . Clotting disorder (HCC)   . Depression   . DVT (deep venous thrombosis) [facility]) Oct 2011  . Dyspnea Oct 2011  . Lupus anticoagulant with hypercoagulable state (HCC) 2011  . Migraines   . Renal failure Oct 2011  . Scoliosis   . Sickle cell trait (HCC)     Allergies: No Known Allergies   Review of Systems:  Review of Systems  Constitutional: Negative for chills, fever and malaise/fatigue.  HENT: Negative for congestion, ear pain and sore throat.   Eyes: Negative.   Respiratory: Negative for cough, shortness of breath and wheezing.   Cardiovascular: Negative for chest pain, palpitations and leg swelling.  Gastrointestinal: Negative for blood in stool, constipation, diarrhea, heartburn and melena.  Genitourinary: Negative for dysuria, frequency, hematuria and urgency.  Musculoskeletal: Positive for myalgias. Negative for back pain, falls, joint pain and neck pain.  Neurological: Negative for dizziness, sensory change, loss of consciousness and headaches.  Psychiatric/Behavioral: Negative for depression. The patient is not nervous/anxious and does not have insomnia.     Family history- Review and unchanged  Social history- Review and unchanged  Physical Exam: BP 118/76   Pulse (!) 113   Temp (!) 97.5 F (36.4 C)   Resp 18   Ht 5' 4.75" (1.645 m)   Wt 228 lb 9.6 oz (103.7 kg)   LMP 08/18/2016   SpO2 98%   BMI 38.34 kg/m  Wt Readings from Last 3 Encounters:  09/22/16 228 lb 9.6 oz (103.7 kg)  07/20/16 235  lb 12.8 oz (107 kg)  04/19/16 234 lb (106.1 kg)    General Appearance: Well nourished well developed, in no apparent distress. Eyes: PERRLA, EOMs, conjunctiva no swelling or erythema ENT/Mouth: Ear canals normal without obstruction, swelling, erythma, discharge.  TMs normal bilaterally.  Oropharynx moist, clear, without exudate, or postoropharyngeal swelling. Neck: Supple, thyroid normal,no cervical adenopathy  Respiratory: Respiratory effort normal, Breath sounds clear A&P without rhonchi, wheeze, or rale.  No retractions, no accessory usage. Cardio: RRR with no MRGs. Brisk peripheral pulses without edema.  Abdomen: Soft, + BS,  Non tender, no guarding, rebound, hernias, masses. Musculoskeletal: Full ROM, 5/5 strength, Normal gait. Good pulses, good sensation bilaterally. Right heel pain, worse with dorsiflexion Skin: Warm, dry without rashes, lesions, ecchymosis.  Neuro: Awake and oriented X 3, Cranial nerves intact. Normal muscle tone, no cerebellar symptoms. Psych: Normal affect, Insight and Judgment appropriate.    Quentin MullingAmanda Collier, PA-C 11:59 AM [facility]

## 2016-09-22 ENCOUNTER — Encounter: Payer: Self-pay | Admitting: Physician Assistant

## 2016-09-22 ENCOUNTER — Ambulatory Visit (INDEPENDENT_AMBULATORY_CARE_PROVIDER_SITE_OTHER): Payer: BLUE CROSS/BLUE SHIELD | Admitting: Physician Assistant

## 2016-09-22 VITALS — BP 118/76 | HR 113 | Temp 97.5°F | Resp 18 | Ht 64.75 in | Wt 228.6 lb

## 2016-09-22 DIAGNOSIS — I82409 Acute embolism and thrombosis of unspecified deep veins of unspecified lower extremity: Secondary | ICD-10-CM | POA: Diagnosis not present

## 2016-09-22 DIAGNOSIS — M722 Plantar fascial fibromatosis: Secondary | ICD-10-CM | POA: Diagnosis not present

## 2016-09-22 DIAGNOSIS — F333 Major depressive disorder, recurrent, severe with psychotic symptoms: Secondary | ICD-10-CM

## 2016-09-22 DIAGNOSIS — Z7901 Long term (current) use of anticoagulants: Secondary | ICD-10-CM | POA: Diagnosis not present

## 2016-09-22 LAB — CBC WITH DIFFERENTIAL/PLATELET
BASOS PCT: 0 %
Basophils Absolute: 0 cells/uL (ref 0–200)
EOS ABS: 76 {cells}/uL (ref 15–500)
Eosinophils Relative: 2 %
HCT: 39.1 % (ref 35.0–45.0)
Hemoglobin: 13 g/dL (ref 11.7–15.5)
Lymphocytes Relative: 33 %
Lymphs Abs: 1254 cells/uL (ref 850–3900)
MCH: 26.6 pg — ABNORMAL LOW (ref 27.0–33.0)
MCHC: 33.2 g/dL (ref 32.0–36.0)
MCV: 80.1 fL (ref 80.0–100.0)
MONOS PCT: 14 %
MPV: 9.5 fL (ref 7.5–12.5)
Monocytes Absolute: 532 cells/uL (ref 200–950)
NEUTROS ABS: 1938 {cells}/uL (ref 1500–7800)
Neutrophils Relative %: 51 %
Platelets: 406 10*3/uL — ABNORMAL HIGH (ref 140–400)
RBC: 4.88 MIL/uL (ref 3.80–5.10)
RDW: 14.5 % (ref 11.0–15.0)
WBC: 3.8 10*3/uL (ref 3.8–10.8)

## 2016-09-22 NOTE — Patient Instructions (Signed)
Vionic shoes can find online but suggest trying on first, at shoe market, [employer] store [place]   Plantar Fasciitis Plantar fasciitis is a painful foot condition that affects the heel. It occurs when the band of tissue that connects the toes to the heel bone (plantar fascia) becomes irritated. This can happen after exercising too much or doing other repetitive activities (overuse injury). The pain from plantar fasciitis can range from mild irritation to severe pain that makes it difficult for you to walk or move. The pain is usually worse in the morning or after you have been sitting or lying down for a while. What are the causes? This condition may be caused by:  Standing for long periods of time.  Wearing shoes that do not fit.  Doing high-impact activities, including running, aerobics, and ballet.  Being overweight.  Having an abnormal way of walking (gait).  Having tight calf muscles.  Having high arches in your feet.  Starting a new athletic activity.  What are the signs or symptoms? The main symptom of this condition is heel pain. Other symptoms include:  Pain that gets worse after activity or exercise.  Pain that is worse in the morning or after resting.  Pain that goes away after you walk for a few minutes.  How is this diagnosed? This condition may be diagnosed based on your signs and symptoms. Your health care provider will also do a physical exam to check for:  A tender area on the bottom of your foot.  A high arch in your foot.  Pain when you move your foot.  Difficulty moving your foot.  You may also need to have imaging studies to confirm the diagnosis. These can include:  X-rays.  Ultrasound.  MRI.  How is this treated? Treatment for plantar fasciitis depends on the severity of the condition. Your treatment may include:  Rest, ice, and over-the-counter pain medicines to manage your pain.  Exercises to stretch your calves and your  plantar fascia.  A splint that holds your foot in a stretched, upward position while you sleep (night splint).  Physical therapy to relieve symptoms and prevent problems in the future.  Cortisone injections to relieve severe pain.  Extracorporeal shock wave therapy (ESWT) to stimulate damaged plantar fascia with electrical impulses. It is often used as a last resort before surgery.  Surgery, if other treatments have not worked after 12 months.  Follow these instructions at home:  Take medicines only as directed by your health care provider.  Avoid activities that cause pain.  Roll the bottom of your foot over a bag of ice or a bottle of cold water. Do this for 20 minutes, 3-4 times a day.  Perform simple stretches as directed by your health care provider.  Try wearing athletic shoes with air-sole or gel-sole cushions or soft shoe inserts.  Wear a night splint while sleeping, if directed by your health care provider.  Keep all follow-up appointments with your health care provider. How is this prevented?  Do not perform exercises or activities that cause heel pain.  Consider finding low-impact activities if you continue to have problems.  Lose weight if you need to. The best way to prevent plantar fasciitis is to avoid the activities that aggravate your plantar fascia. Contact a health care provider if:  Your symptoms do not go away after treatment with home care measures.  Your pain gets worse.  Your pain affects your ability to move or do your daily  activities. This information is not intended to replace advice given to you by your health care provider. Make sure you discuss any questions you have with your health care provider. Document Released: 11/15/2000 Document Revised: 07/26/2015 Document Reviewed: 12/31/2013 Elsevier Interactive Patient Education  [employer]2018 Elsevier Inc.

## 2016-09-23 LAB — PROTIME-INR
INR: 3.7 — AB
PROTHROMBIN TIME: 38.5 s — AB (ref 9.0–11.5)

## 2016-09-23 LAB — BASIC METABOLIC PANEL WITH GFR
BUN: 12 mg/dL (ref 7–25)
CHLORIDE: 103 mmol/L (ref 98–110)
CO2: 24 mmol/L (ref 20–31)
Calcium: 9 mg/dL (ref 8.6–10.2)
Creat: 0.66 mg/dL (ref 0.50–1.10)
Glucose, Bld: 70 mg/dL (ref 65–99)
POTASSIUM: 4.9 mmol/L (ref 3.5–5.3)
SODIUM: 136 mmol/L (ref 135–146)

## 2016-09-24 ENCOUNTER — Other Ambulatory Visit: Payer: Self-pay | Admitting: Physician Assistant

## 2016-09-24 DIAGNOSIS — Z7901 Long term (current) use of anticoagulants: Secondary | ICD-10-CM

## 2016-10-06 ENCOUNTER — Other Ambulatory Visit: Payer: BLUE CROSS/BLUE SHIELD

## 2016-10-06 DIAGNOSIS — I82409 Acute embolism and thrombosis of unspecified deep veins of unspecified lower extremity: Secondary | ICD-10-CM | POA: Diagnosis not present

## 2016-10-06 DIAGNOSIS — Z7901 Long term (current) use of anticoagulants: Secondary | ICD-10-CM

## 2016-10-07 LAB — PROTIME-INR
INR: 1.9 — AB
Prothrombin Time: 19.8 s — ABNORMAL HIGH (ref 9.0–11.5)

## 2016-10-07 NOTE — Progress Notes (Unsigned)
Future Appointments Date Time Provider Department Center  11/02/2016 4:00 PM Quentin MullingCollier, Jaquil [place] [facility] None  04/23/2017 3:00 PM Quentin Mullingollier, Natina [place], PA-C [facility] None

## 2016-11-02 ENCOUNTER — Ambulatory Visit: Payer: Self-pay | Admitting: Physician Assistant

## 2016-11-14 ENCOUNTER — Other Ambulatory Visit: Payer: Self-pay | Admitting: Internal Medicine

## 2016-11-14 DIAGNOSIS — D6862 Lupus anticoagulant syndrome: Secondary | ICD-10-CM

## 2016-11-15 ENCOUNTER — Ambulatory Visit (INDEPENDENT_AMBULATORY_CARE_PROVIDER_SITE_OTHER): Payer: BLUE CROSS/BLUE SHIELD | Admitting: Physician Assistant

## 2016-11-15 DIAGNOSIS — I82409 Acute embolism and thrombosis of unspecified deep veins of unspecified lower extremity: Secondary | ICD-10-CM | POA: Diagnosis not present

## 2016-11-15 DIAGNOSIS — T45515A Adverse effect of anticoagulants, initial encounter: Secondary | ICD-10-CM

## 2016-11-15 DIAGNOSIS — D6832 Hemorrhagic disorder due to extrinsic circulating anticoagulants: Secondary | ICD-10-CM

## 2016-11-15 NOTE — Assessment & Plan Note (Signed)
Patient on coumadin for lupus anticoagulant Continue coumadin Call the office if any easy bleeding, bruising, blood in urine or stool.

## 2016-11-15 NOTE — Assessment & Plan Note (Signed)
  4 days a week, 7.5 3 days a week.

## 2016-11-15 NOTE — Progress Notes (Signed)
Patient ID: Anna Butler, female   DOB: 01/02/1996, 21 y.o.   MRN: [id number]  PATIENT NOT SEEN TODAY, WILL DO LAB ONLY FOLLOW UP 1 MONTH  Review of Systems:  Review of Systems  Constitutional: Negative for chills, fever and malaise/fatigue.  HENT: Negative for congestion, ear pain and sore throat.   Eyes: Negative.   Respiratory: Negative for cough, shortness of breath and wheezing.   Cardiovascular: Negative for chest pain, palpitations and leg swelling.  Gastrointestinal: Negative for blood in stool, constipation, diarrhea, heartburn and melena.  Genitourinary: Negative for dysuria, frequency, hematuria and urgency.  Musculoskeletal: Positive for myalgias. Negative for back pain, falls, joint pain and neck pain.  Neurological: Negative for dizziness, sensory change, loss of consciousness and headaches.  Psychiatric/Behavioral: Negative for depression. The patient is not nervous/anxious and does not have insomnia.

## 2016-11-16 LAB — PROTIME-INR
INR: 2.4 — ABNORMAL HIGH
Prothrombin Time: 25.1 s — ABNORMAL HIGH (ref 9.0–11.5)

## 2016-12-12 ENCOUNTER — Ambulatory Visit: Payer: Self-pay | Admitting: Adult Health

## 2016-12-13 ENCOUNTER — Encounter: Payer: Self-pay | Admitting: Adult Health

## 2016-12-13 DIAGNOSIS — E669 Obesity, unspecified: Secondary | ICD-10-CM | POA: Insufficient documentation

## 2016-12-13 NOTE — Progress Notes (Deleted)
FOLLOW UP  Assessment and Plan:   No problem-specific Assessment & Plan notes found for this encounter.  Chronic anticoagulation  Check INR and will adjust medication according to labs.   Discussed if patient falls to immediately contact office or go to ER. Discussed foods that can increase or decrease Coumadin levels. Patient understands to call the office before starting a new medication.  Follow up in one month.   Obesity with co morbidities  Long discussion about weight loss, diet, and exercise  Discussed goal weight for height: under 150 lb  Depression/anxiety  Continue medications: effexor/vistaril***  Lifestyle discussed: diet/exerise, sleep hygiene, stress management, hydration  Continue diet and meds as discussed. Further disposition pending results of labs. Discussed med's effects and SE's.   Over 30 minutes of exam, counseling, chart review, and critical decision making was performed.   Future Appointments Date Time Provider Department Center  12/14/2016 4:00 PM Judd Gaudier, NP [facility] None  01/18/2017 4:15 PM Quentin Mulling, PA-C [facility] None  04/23/2017 3:00 PM Quentin Mulling, PA-C [facility] None    ----------------------------------------------------------------------------------------------------------------------  HPI 21 y.o. female  presents for 1 month follow up on current coumadin use post DVT, obesity, and major depression***  Major depression previously managed by   Current Outpatient Prescriptions on File Prior to Visit  Medication Sig Dispense Refill  . buPROPion (WELLBUTRIN SR) 150 MG 12 hr tablet Take 1 tablet (150 mg total) by mouth daily. 30 tablet 0  . hydrOXYzine (ATARAX/VISTARIL) 25 MG tablet Take 1 tablet (25 mg total) by mouth every 6 (six) hours as needed for anxiety. 30 tablet 0  . traZODone (DESYREL) 50 MG tablet Take 1 tablet (50 mg total) by mouth at bedtime as needed for sleep. 30 tablet 0  . venlafaxine XR  (EFFEXOR-XR) 37.5 MG 24 hr capsule Take 1 capsule (37.5 mg total) by mouth daily. 30 capsule 0  . warfarin (COUMADIN) 7.5 MG tablet Take 2 tablets (15 mg total) by mouth one time only at 6 PM.       Hematology??  Patient is on Coumadin for Acute thromboembolism of deep veins of lower extremity, unspecified laterality (HCC) [I82.409] Patient's last INR is  Lab Results  Component Value Date   INR 2.4 (H) 11/15/2016   INR 1.9 (H) 10/06/2016   INR 3.7 (H) 09/22/2016    Patient denies SOB, CP, dizziness, nose bleeds, easy bleeding, and blood in stool/urine. Her coumadin dose was not changed last visit. She {HAS HAS NOT:18834} taken ABX, {HAS HAS NOT:18834} missed any doses and {Actions; denies-reports:120008} a fall.    BMI is There is no height or weight on file to calculate BMI., she is working on diet and exercise. She was taken off of phentermine at last visit due to insufficient response to medication.  Wt Readings from Last 3 Encounters:  09/22/16 228 lb 9.6 oz (103.7 kg)  07/20/16 235 lb 12.8 oz (107 kg)  04/19/16 234 lb (106.1 kg)    Current Medications:  Current Outpatient Prescriptions on File Prior to Visit  Medication Sig  . hydrOXYzine (ATARAX/VISTARIL) 25 MG tablet Take 1 tablet (25 mg total) by mouth every 6 (six) hours as needed for anxiety.  [name] warfarin (COUMADIN) 5 MG tablet TAKE 2 TABLETS BY MOUTH DAILY OR AS DIRECTED BY PROVIDER   No current facility-administered medications on file prior to visit.      Allergies: No Known Allergies   Medical History:  Past Medical History:  Diagnosis Date  . ADD (attention deficit  disorder)   . Anemia   . Clotting disorder (HCC)   . Depression   . DVT (deep venous thrombosis) [facility]) Oct 2011  . Dyspnea Oct 2011  . Lupus anticoagulant with hypercoagulable state (HCC) 2011  . Migraines   . Renal failure Oct 2011  . Scoliosis   . Sickle cell trait (HCC)   . 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 Wt Readings from Last 3 Encounters:  09/22/16 228 lb 9.6 oz (103.7 kg)  07/20/16 235 lb 12.8 oz (107 kg)  04/19/16 234 lb (106.1 kg)   General Appearance: Well nourished, in no apparent distress. Eyes: PERRLA, EOMs, conjunctiva no swelling or erythema Sinuses: No Frontal/maxillary tenderness ENT/Mouth: Ext aud canals clear, TMs without erythema, bulging. No erythema, swelling, or exudate on post pharynx.  Tonsils not swollen or erythematous. Hearing normal.  Neck: Supple, thyroid normal.  Respiratory: Respiratory effort normal, BS equal bilaterally without rales, rhonchi, wheezing or stridor.  Cardio: RRR with no MRGs. Brisk peripheral pulses without edema.  Abdomen: Soft, + BS.  Non tender, no guarding, rebound, hernias, masses. Lymphatics: Non tender without lymphadenopathy.  Musculoskeletal: Full ROM, 5/5 strength, {PSY - GAIT AND STATION:22860} gait Skin: Warm, dry without rashes, lesions, ecchymosis.  Neuro: Cranial nerves intact. No cerebellar symptoms.  Psych: Awake and oriented X 3, normal affect, Insight and Judgment appropriate.    Dan Maker, NP 5:41 PM [facility]

## 2016-12-14 ENCOUNTER — Ambulatory Visit: Payer: Self-pay | Admitting: Adult Health

## 2016-12-20 ENCOUNTER — Encounter: Payer: Self-pay | Admitting: Adult Health

## 2016-12-20 ENCOUNTER — Ambulatory Visit (INDEPENDENT_AMBULATORY_CARE_PROVIDER_SITE_OTHER): Payer: BLUE CROSS/BLUE SHIELD | Admitting: Adult Health

## 2016-12-20 VITALS — BP 120/68 | HR 94 | Temp 97.5°F | Ht 64.75 in | Wt 228.6 lb

## 2016-12-20 DIAGNOSIS — Z79899 Other long term (current) drug therapy: Secondary | ICD-10-CM | POA: Diagnosis not present

## 2016-12-20 DIAGNOSIS — D6861 Antiphospholipid syndrome: Secondary | ICD-10-CM | POA: Diagnosis not present

## 2016-12-20 DIAGNOSIS — Z7901 Long term (current) use of anticoagulants: Secondary | ICD-10-CM | POA: Diagnosis not present

## 2016-12-20 DIAGNOSIS — D573 Sickle-cell trait: Secondary | ICD-10-CM | POA: Diagnosis not present

## 2016-12-20 MED ORDER — PROMETHAZINE-DM 6.25-15 MG/5ML PO SYRP: 5.0000 mL | ORAL_SOLUTION | Freq: Four times a day (QID) | ORAL | 1 refills | Status: AC | PRN

## 2016-12-20 NOTE — Progress Notes (Signed)
FOLLOW UP  Assessment and Plan:   Chronic anticoagulation/antiphosopholipid syndrome/sickle cell trait with multiple DVTs  Check INR and will adjust medication according to labs.   Discussed if patient falls to immediately contact office or go to ER. Discussed foods that can increase or decrease Coumadin levels. Patient understands to call the office before starting a new medication.  Follow up in one month.   Obesity   Long discussion about weight loss, diet, and exercise  Discussed weight loss goal- she would like to lose 50lb by next May  Patient to start food diary, watching quality of carbs  Depression/anxiety  Currently stable without symptoms off of all medications  Lifestyle discussed: diet/exerise, sleep hygiene, stress management, hydration  URI  Unremarkable exam; discussed likely viral URI that will resolve with supportive treatment. Information and cough medication provided, encouraged to call or present to ED should symptoms acutely worsen.   Continue diet and meds as discussed. Further disposition pending results of labs. Discussed med's effects and SE's.   Over 30 minutes of exam, counseling, chart review, and critical decision making was performed.   Future Appointments Date Time Provider Department Center  01/18/2017 4:15 PM Quentin Mullingollier, Amanda, PA-C [facility] None  04/23/2017 3:00 PM Quentin Mullingollier, Amanda, PA-C [facility] None    ----------------------------------------------------------------------------------------------------------------------  HPI BP 120/68   Pulse 94   Temp (!) 97.5 F (36.4 C)   Ht 5' 4.75" (1.645 m)   Wt 228 lb 9.6 oz (103.7 kg)   SpO2 98%   BMI 38.34 kg/m   21 y.o. female  presents for 1 month follow up on current coumadin use post DVT, obesity, and major depression. She has been seeing a therapist regularly, and is reportedly doing well enough to come off of medications several months ago. She also c/o sore throat x 2 weeks,  productive cough of green phlem. Denies fever/chills, took tylenol cold medicine a few times without noting much improvement several days ago. Report symptoms are improving.   She is on coumadin for antiphospholipic syndrome, with frequent DVTs and +sickle cell trait. Taking 7.5 mg x 1.5 tabs everyday except Sunday takes 2 tabs. Denies concerns with bleeding, no recent antibiotics, missed doses.  Patient's last INR is  Lab Results  Component Value Date   INR 2.4 (H) 11/15/2016   INR 1.9 (H) 10/06/2016   INR 3.7 (H) 09/22/2016    She is concerned about her current weight and motivated to lose 50 lb by next spring, however is discouraged as she feels every time she starts an exercise regimen she seems to gain weight.   BMI is Body mass index is 38.34 kg/m., she is not currently working on diet and exercise. Wt Readings from Last 3 Encounters:  12/20/16 228 lb 9.6 oz (103.7 kg)  09/22/16 228 lb 9.6 oz (103.7 kg)  07/20/16 235 lb 12.8 oz (107 kg)   Discussed some current food choices and some simple switches to make (stop raisin bran, get plain bran flakes and add fresh fruit, etc). Discussed diet is much more influential on weight than exercise, general information for carbohydrate selection, etc provided, suggested she start a food diary. Discussed medications are an option to assist with lifestyle changes.     Current Outpatient Prescriptions on File Prior to Visit  Medication Sig Dispense Refill  . buPROPion (WELLBUTRIN SR) 150 MG 12 hr tablet Take 1 tablet (150 mg total) by mouth daily. 30 tablet 0  . hydrOXYzine (ATARAX/VISTARIL) 25 MG tablet Take 1 tablet (25  mg total) by mouth every 6 (six) hours as needed for anxiety. 30 tablet 0  . traZODone (DESYREL) 50 MG tablet Take 1 tablet (50 mg total) by mouth at bedtime as needed for sleep. 30 tablet 0  . venlafaxine XR (EFFEXOR-XR) 37.5 MG 24 hr capsule Take 1 capsule (37.5 mg total) by mouth daily. 30 capsule 0  . warfarin (COUMADIN) 7.5  MG tablet Take 2 tablets (15 mg total) by mouth one time only at 6 PM.       Current Medications:  Current Outpatient Prescriptions on File Prior to Visit  Medication Sig  . warfarin (COUMADIN) 5 MG tablet TAKE 2 TABLETS BY MOUTH DAILY OR AS DIRECTED BY PROVIDER   No current facility-administered medications on file prior to visit.      Allergies: No Known Allergies   Medical History:  Past Medical History:  Diagnosis Date  . ADD (attention deficit disorder)   . Anemia   . Clotting disorder (HCC)   . Depression   . DVT (deep venous thrombosis) [facility]) Oct 2011  . Dyspnea Oct 2011  . Lupus anticoagulant with hypercoagulable state (HCC) 2011  . Migraines   . Renal failure Oct 2011  . Scoliosis   . Sickle cell trait (HCC)   . Warfarin-induced coagulopathy (HCC) 04/15/2010   As of 01/02 she is on 2 pills 4 days a week and 1.5 pills 3 days a week   Family history- Reviewed and unchanged Social history- Reviewed and unchanged   Review of Systems:  Review of Systems  Constitutional: Negative.  Negative for chills, diaphoresis, fever and malaise/fatigue.  HENT: Positive for congestion and sore throat. Negative for hearing loss and nosebleeds.   Eyes: Negative for blurred vision.  Respiratory: Positive for cough and sputum production. Negative for hemoptysis, shortness of breath and wheezing.   Cardiovascular: Negative for chest pain and palpitations.  Gastrointestinal: Negative for abdominal pain, blood in stool, constipation, diarrhea, heartburn, melena, nausea and vomiting.  Genitourinary: Negative.  Negative for hematuria.  Musculoskeletal: Negative for myalgias.  Skin: Negative.  Negative for rash.  Neurological: Negative for dizziness, sensory change and headaches.  Endo/Heme/Allergies: Negative.  Does not bruise/bleed easily.  Psychiatric/Behavioral: Negative.       Physical Exam: BP 120/68   Pulse 94   Temp (!) 97.5 F (36.4 C)   Ht 5' 4.75" (1.645 m)   Wt 228  lb 9.6 oz (103.7 kg)   SpO2 98%   BMI 38.34 kg/m  Wt Readings from Last 3 Encounters:  12/20/16 228 lb 9.6 oz (103.7 kg)  09/22/16 228 lb 9.6 oz (103.7 kg)  07/20/16 235 lb 12.8 oz (107 kg)   General Appearance: Well nourished, obese female in no apparent distress. Eyes: PERRLA, EOMs, conjunctiva no swelling or erythema Sinuses: No Frontal/maxillary tenderness ENT/Mouth: Ext aud canals clear, TMs without erythema, bulging. Posterior pharynx mildly injected without notable swelling, or exudate.  Tonsils not swollen or erythematous. Hearing normal.  Neck: Supple, thyroid normal.  Respiratory: Respiratory effort normal, BS equal bilaterally without rales, rhonchi, wheezing or stridor.  Cardio: RRR with no MRGs. Brisk peripheral pulses without edema.  Abdomen: Soft, + BS.  Non tender, no guarding, rebound, hernias, masses. Lymphatics: Non tender without lymphadenopathy.  Musculoskeletal: Full ROM, 5/5 strength, Normal gait Skin: Warm, dry without rashes, lesions, ecchymosis.  Neuro: Cranial nerves intact. No cerebellar symptoms.  Psych: Awake and oriented X 3, normal affect, Insight and Judgment appropriate.    Dan Maker, NP 6:37 PM [facility]  Adult & Adolescent Internal Medicine

## 2016-12-20 NOTE — Patient Instructions (Addendum)
Try Dave's bread - avocado with poached egg in morning     Bad carbs also include fruit juice, alcohol, and sweet tea. These are empty calories that do not signal to your brain that you are full.   Please remember the good carbs are still carbs which convert into sugar. So please measure them out no more than 1/2-1 cup of rice, oatmeal, pasta, and beans  Veggies are however free foods! Pile them on.   Not all fruit is created equal. Please see the list below, the fruit at the bottom is higher in sugars than the fruit at the top. Please avoid all dried fruits.       HOW TO TREAT VIRAL COUGH AND COLD SYMPTOMS:  -Symptoms usually last at least 1 week with the worst symptoms being around day 4.  - colds usually start with a sore throat and end with a cough, and the cough can take 2 weeks to get better.  -No antibiotics are needed for colds, flu, sore throats, cough, bronchitis UNLESS symptoms are longer than 7 days OR if you are getting better then get drastically worse.  -There are a lot of combination medications (Dayquil, Nyquil, Vicks 44, tyelnol cold and sinus, ETC). Please look at the ingredients on the back so that you are treating the correct symptoms and not doubling up on medications/ingredients.    Medicines you can use  Nasal congestion  - pseudoephedrine (Sudafed)- behind the counter, do not use if you have high blood pressure, medicine that have -D in them.  - phenylephrine (Sudafed PE) -Dextormethorphan + chlorpheniramine (Coridcidin HBP)- okay if you have high blood pressure -Oxymetazoline (Afrin) nasal spray- LIMIT to 3 days -Saline nasal spray -Neti pot (used distilled or bottled water)  Ear pain/congestion  -pseudoephedrine (sudafed) - Nasonex/flonase nasal spray  Fever  -Acetaminophen (Tyelnol) -Ibuprofen (Advil, motrin, aleve)  Sore Throat  -Acetaminophen (Tyelnol) -Ibuprofen (Advil, motrin, aleve) -Drink a lot of water -Gargle with salt water - Rest  your voice (don't talk) -Throat sprays -Cough drops  Body Aches  -Acetaminophen (Tyelnol) -Ibuprofen (Advil, motrin, aleve)  Headache  -Acetaminophen (Tyelnol) -Ibuprofen (Advil, motrin, aleve) - Exedrin, Exedrin Migraine  Allergy symptoms (cough, sneeze, runny nose, itchy eyes) -Claritin or loratadine cheapest but likely the weakest  -Zyrtec or certizine at night because it can make you sleepy -The strongest is allegra or fexafinadine  Cheapest at [employer], [employer], [employer]  Cough  -Dextromethorphan (Delsym)- medicine that has DM in it -Guafenesin (Mucinex/Robitussin) - cough drops - drink lots of water  Chest Congestion  -Guafenesin (Mucinex/Robitussin)  Red Itchy Eyes  - Naphcon-A  Upset Stomach  - Bland diet (nothing spicy, greasy, fried, and high acid foods like tomatoes, oranges, berries) -OKAY- cereal, bread, soup, crackers, rice -Eat smaller more frequent meals -reduce caffeine, no alcohol -Loperamide (Imodium-AD) if diarrhea -Prevacid for heart burn  General health when sick  -Hydration -wash your hands frequently -keep surfaces clean -change pillow cases and sheets often -Get fresh air but do not exercise strenuously -Vitamin D, double up on it - Vitamin C -Zinc

## 2016-12-20 NOTE — Progress Notes (Signed)
120/68

## 2016-12-21 LAB — BASIC METABOLIC PANEL WITH GFR
BUN: 14 mg/dL (ref 7–25)
CO2: 24 mmol/L (ref 20–32)
CREATININE: 0.61 mg/dL (ref 0.50–1.10)
Calcium: 9.4 mg/dL (ref 8.6–10.2)
Chloride: 106 mmol/L (ref 98–110)
GFR, Est African American: 149 mL/min/{1.73_m2} (ref >=60)
GFR, Est Non African American: 129 mL/min/{1.73_m2} (ref >=60)
GLUCOSE: 81 mg/dL (ref 65–99)
Potassium: 4.5 mmol/L (ref 3.5–5.3)
SODIUM: 137 mmol/L (ref 135–146)

## 2016-12-21 LAB — CBC WITH DIFFERENTIAL/PLATELET
BASOS ABS: 32 {cells}/uL (ref 0–200)
BASOS PCT: 0.6 %
EOS PCT: 1.7 %
Eosinophils Absolute: 90 cells/uL (ref 15–500)
HEMATOCRIT: 35.1 % (ref 35.0–45.0)
HEMOGLOBIN: 11.9 g/dL (ref 11.7–15.5)
LYMPHS ABS: 1685 {cells}/uL (ref 850–3900)
MCH: 26.4 pg — ABNORMAL LOW (ref 27.0–33.0)
MCHC: 33.9 g/dL (ref 32.0–36.0)
MCV: 78 fL — ABNORMAL LOW (ref 80.0–100.0)
MONOS PCT: 14.1 %
MPV: 9.5 fL (ref 7.5–12.5)
NEUTROS ABS: 2745 {cells}/uL (ref 1500–7800)
Neutrophils Relative %: 51.8 %
PLATELETS: 400 10*3/uL (ref 140–400)
RBC: 4.5 10*6/uL (ref 3.80–5.10)
RDW: 15.6 % — AB (ref 11.0–15.0)
Total Lymphocyte: 31.8 %
WBC mixed population: 747 cells/uL (ref 200–950)
WBC: 5.3 10*3/uL (ref 3.8–10.8)

## 2016-12-21 LAB — PROTIME-INR
INR: 2 — AB
PROTHROMBIN TIME: 21.3 s — AB (ref 9.0–11.5)

## 2016-12-26 ENCOUNTER — Ambulatory Visit: Payer: Self-pay | Admitting: Adult Health

## 2017-01-17 ENCOUNTER — Other Ambulatory Visit: Payer: Self-pay | Admitting: Adult Health

## 2017-01-17 DIAGNOSIS — D6861 Antiphospholipid syndrome: Secondary | ICD-10-CM

## 2017-01-17 DIAGNOSIS — Z7901 Long term (current) use of anticoagulants: Secondary | ICD-10-CM

## 2017-01-17 DIAGNOSIS — Z79899 Other long term (current) drug therapy: Secondary | ICD-10-CM

## 2017-01-18 ENCOUNTER — Ambulatory Visit: Payer: Self-pay | Admitting: Adult Health

## 2017-01-18 ENCOUNTER — Other Ambulatory Visit: Payer: Self-pay

## 2017-01-20 ENCOUNTER — Other Ambulatory Visit: Payer: Self-pay | Admitting: Physician Assistant

## 2017-01-20 DIAGNOSIS — D6862 Lupus anticoagulant syndrome: Secondary | ICD-10-CM

## 2017-01-22 ENCOUNTER — Other Ambulatory Visit: Payer: BLUE CROSS/BLUE SHIELD

## 2017-01-22 DIAGNOSIS — Z7901 Long term (current) use of anticoagulants: Secondary | ICD-10-CM | POA: Diagnosis not present

## 2017-01-22 DIAGNOSIS — D6861 Antiphospholipid syndrome: Secondary | ICD-10-CM

## 2017-01-22 DIAGNOSIS — Z79899 Other long term (current) drug therapy: Secondary | ICD-10-CM

## 2017-01-23 ENCOUNTER — Other Ambulatory Visit: Payer: Self-pay | Admitting: Adult Health

## 2017-01-23 DIAGNOSIS — D6861 Antiphospholipid syndrome: Secondary | ICD-10-CM

## 2017-01-23 DIAGNOSIS — Z7901 Long term (current) use of anticoagulants: Secondary | ICD-10-CM

## 2017-01-23 DIAGNOSIS — D573 Sickle-cell trait: Secondary | ICD-10-CM

## 2017-01-23 LAB — CBC WITH DIFFERENTIAL/PLATELET
BASOS ABS: 29 {cells}/uL (ref 0–200)
Basophils Relative: 0.6 %
EOS ABS: 110 {cells}/uL (ref 15–500)
Eosinophils Relative: 2.3 %
HCT: 36 % (ref 35.0–45.0)
Hemoglobin: 12 g/dL (ref 11.7–15.5)
Lymphs Abs: 1944 cells/uL (ref 850–3900)
MCH: 26.4 pg — AB (ref 27.0–33.0)
MCHC: 33.3 g/dL (ref 32.0–36.0)
MCV: 79.3 fL — AB (ref 80.0–100.0)
MONOS PCT: 12.5 %
MPV: 9.7 fL (ref 7.5–12.5)
Neutro Abs: 2117 cells/uL (ref 1500–7800)
Neutrophils Relative %: 44.1 %
PLATELETS: 383 10*3/uL (ref 140–400)
RBC: 4.54 10*6/uL (ref 3.80–5.10)
RDW: 15.4 % — ABNORMAL HIGH (ref 11.0–15.0)
TOTAL LYMPHOCYTE: 40.5 %
WBC: 4.8 10*3/uL (ref 3.8–10.8)
WBCMIX: 600 {cells}/uL (ref 200–950)

## 2017-01-23 LAB — BASIC METABOLIC PANEL WITH GFR
BUN: 9 mg/dL (ref 7–25)
CALCIUM: 9.4 mg/dL (ref 8.6–10.2)
CHLORIDE: 103 mmol/L (ref 98–110)
CO2: 25 mmol/L (ref 20–32)
Creat: 0.54 mg/dL (ref 0.50–1.10)
GFR, Est African American: 155 mL/min/{1.73_m2} (ref >=60)
GFR, Est Non African American: 134 mL/min/{1.73_m2} (ref >=60)
Glucose, Bld: 61 mg/dL — ABNORMAL LOW (ref 65–99)
POTASSIUM: 4.5 mmol/L (ref 3.5–5.3)
Sodium: 138 mmol/L (ref 135–146)

## 2017-01-23 LAB — PROTIME-INR
INR: 2 — AB
PROTHROMBIN TIME: 20.9 s — AB (ref 9.0–11.5)

## 2017-01-23 LAB — SPECIMEN COMPROMISED

## 2017-02-21 ENCOUNTER — Other Ambulatory Visit: Payer: BLUE CROSS/BLUE SHIELD

## 2017-02-21 DIAGNOSIS — D6861 Antiphospholipid syndrome: Secondary | ICD-10-CM

## 2017-02-21 DIAGNOSIS — Z7901 Long term (current) use of anticoagulants: Secondary | ICD-10-CM

## 2017-02-21 DIAGNOSIS — D573 Sickle-cell trait: Secondary | ICD-10-CM

## 2017-02-22 ENCOUNTER — Other Ambulatory Visit: Payer: Self-pay | Admitting: Adult Health

## 2017-02-22 DIAGNOSIS — Z7901 Long term (current) use of anticoagulants: Secondary | ICD-10-CM

## 2017-02-22 LAB — PROTIME-INR
INR: 2.7 — AB
Prothrombin Time: 27.9 s — ABNORMAL HIGH (ref 9.0–11.5)

## 2017-03-14 ENCOUNTER — Other Ambulatory Visit: Payer: Self-pay | Admitting: Internal Medicine

## 2017-03-14 DIAGNOSIS — D6862 Lupus anticoagulant syndrome: Secondary | ICD-10-CM

## 2017-04-04 ENCOUNTER — Other Ambulatory Visit: Payer: BLUE CROSS/BLUE SHIELD

## 2017-04-04 DIAGNOSIS — Z7901 Long term (current) use of anticoagulants: Secondary | ICD-10-CM | POA: Diagnosis not present

## 2017-04-05 LAB — PROTIME-INR
INR: 1.5 — AB
PROTHROMBIN TIME: 16.2 s — AB (ref 9.0–11.5)

## 2017-04-13 ENCOUNTER — Encounter: Payer: Self-pay | Admitting: Internal Medicine

## 2017-04-19 ENCOUNTER — Encounter: Payer: Self-pay | Admitting: Internal Medicine

## 2017-04-23 ENCOUNTER — Encounter: Payer: Self-pay | Admitting: Physician Assistant

## 2017-04-28 ENCOUNTER — Other Ambulatory Visit: Payer: Self-pay | Admitting: Adult Health

## 2017-04-28 DIAGNOSIS — D6862 Lupus anticoagulant syndrome: Secondary | ICD-10-CM

## 2017-05-15 DIAGNOSIS — E785 Hyperlipidemia, unspecified: Secondary | ICD-10-CM | POA: Insufficient documentation

## 2017-05-15 NOTE — Progress Notes (Signed)
Complete Physical  Assessment and Plan:  Diagnoses and all orders for this visit:  Routine general medical examination at a health care facility  Antiphospholipid syndrome [facility](HCC) Check INR and will adjust medication according to labs.  Discussed if patient falls to immediately contact office or go to ER. Discussed foods that can increase or decrease Coumadin levels. Patient understands to call the office before starting a new medication. Follow up in one month.  -     Protime-INR  Current use of long term anticoagulation Continue monthly follow up or more as needed to maintain goal Discussed if patient falls to immediately contact office or go to ER. Discussed foods that can increase or decrease Coumadin levels. Patient understands to call the office before starting a new medication.  Major depressive disorder, recurrent severe without psychotic features (HCC) Remains poorly controlled; currently on 6 mg/day emsam patch, managed by psychiatry; has failed multiple alternative agents. Strongly recommended she follow up sooner to discuss lack of progress with provider. Suggested some alternate newer agents to discuss with psych provider.  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 App or similar, meal plan ahead, avoid keeping junk food at home, increase water intake, start walking for exercise Discussed appropriate weight for height Follow up at next visit  Screening for hyperlipidemia -     Lipid panel  Medication management -     CBC with  Differential/Platelet -     BASIC METABOLIC PANEL WITH GFR -     Hepatic function panel  Screening for diabetes mellitus -     Hemoglobin A1c  Screening for thyroid disorder -     TSH  Vitamin D deficiency Patient not currently supplementing - recommend 5000 U or as needed to achieve and maintain 70-100 -     VITAMIN D 25 Hydroxy (Vit-D Deficiency, Fractures)  Screening for deficiency anemia -     Vitamin B12  Screening for hematuria or proteinuria -     Urinalysis w microscopic + reflex cultur -     Microalbumin / creatinine urine ratio  Discussed med's effects and SE's. Screening labs and tests as requested with regular follow-up as recommended. Over 40 minutes of exam, counseling, chart review, and complex, high level critical decision making was performed this visit.   Future Appointments  Date Time Provider Department Center  06/19/2017  4:00 PM [facility] NURSE [facility] None  07/19/2017  4:00 PM [facility] NURSE [facility] None  08/23/2017  4:00 PM Judd Gaudierorbett, Terese Heier, NP [facility] None  05/20/2018  3:00 PM Judd Gaudierorbett, Madelynn Malson, NP [facility] None     HPI  22 y.o. female  presents for a complete physical and follow up for has Major depressive disorder, recurrent severe without psychotic features (HCC); Antiphospholipid syndrome (HCC); Sickle cell trait (HCC); Current use of long term anticoagulation; Obesity; and Hyperlipidemia on their problem list. She is managed by psychiatry/therapist for depression/anxiety - she is currently on emsam 6 mg/day patch. Reports she has not noted much improvement on this agent, has been noting anxiety, difficulty focusing and ongoing severe depression, PHQ-9. Reports has previously been on zoloft, effoxor, lamictal, wellbutrin without notable benefit.  Patient is on Coumadin for Antiphospholipid syndrome and recurrent acute DVTs. Patient's last INR is  Lab Results  Component Value Date   INR 1.5 (H) 04/04/2017   INR 2.7 (H) 02/21/2017   INR 2.0  (H) 01/22/2017    Patient denies SOB, CP, dizziness, nose bleeds, easy bleeding, and blood in stool/urine. Her coumadin dose was changed last visit. She has not taken ABX, has not missed any doses and denies a fall.    5 mg -2 tabs 5 days and week and 1.5 tabs 2 days a week on Monday and Thursday.    BMI is Body mass index is 39.07 kg/m., she has not been working on diet and exercise. 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 She reports a vegetarian diet, but does not pay attention to portions, caloric intake or macros.  Wt Readings from Last 3 Encounters:  05/16/17 233 lb (105.7 kg)  12/20/16 228 lb 9.6 oz (103.7 kg)  09/22/16 228 lb 9.6 oz (103.7 kg)  Breakfast: Skips or some crackers Lunch: leftovers, potatoes/cornbread/cabbage, vegetarian chicken nuggets, etc Dinner: similar to lunch - "whatever"  Exercise: None Water intake: 2 bottles daily  Today their BP is BP: 110/70 She does not workout. She denies chest pain, shortness of breath, dizziness.   She is not on cholesterol medication and denies myalgias. Her cholesterol is not at goal. The cholesterol last visit was:   Lab Results  Component Value Date   CHOL 190 04/19/2016   HDL 40 (L) 04/19/2016   LDLCALC 132 (H) 04/19/2016   TRIG 89 04/19/2016   CHOLHDL 4.8 04/19/2016    Last A1C in the office was:  Lab Results  Component Value Date   HGBA1C 5.2 04/19/2016   Last GFR: Lab Results  Component Value Date   GFRAA 155 01/22/2017   Patient is not on Vitamin D supplement and very low at last check:   Lab Results  Component Value Date   VD25OH 22 (L) 04/19/2016      Current Medications:  Current Outpatient Medications on File Prior to Visit  Medication Sig Dispense Refill  . selegiline (EMSAM) 12 MG/24HR Place 1 patch onto the skin daily.    . promethazine-dextromethorphan (PROMETHAZINE-DM) 6.25-15 MG/5ML syrup Take 5 mLs by mouth 4 (four) times daily as needed for cough. 240 mL 1   No current facility-administered  medications on file prior to visit.    Allergies:  No Known Allergies Medical History:  She has Major depressive disorder, recurrent severe without psychotic features (HCC); Antiphospholipid syndrome (HCC); Sickle cell trait (HCC); Current use of long term anticoagulation; Obesity; and Hyperlipidemia on their problem list. Health Maintenance:   Immunization History  Administered Date(s) Administered  . Hepatitis A 09/06/2011  . Meningococcal Conjugate 09/11/2012  . Tdap 03/06/2010    Tetanus:2012 Flu vaccine: refuses HPV: Had as a teen? Will check with mom to verify  LMP: Patient's last menstrual period was 04/18/2017. Pap: Never been sexually active, requests to postpone after dicussion of risks/benefits MGM: n/a  Last Dental Exam: 2018, has appointment this Friday Last Eye Exam: Wears glasses, 2018  Patient Care Team: Lucky Cowboy, MD as PCP - General (Internal Medicine)  Surgical History:  She has a past surgical history that includes Incise and drain abcess. Family History:  Herfamily history includes Anxiety disorder in her father; Bipolar disorder in her maternal aunt; Depression in her father; Heart disease in her maternal grandfather; Hypertension in her mother; Lung cancer in her maternal grandfather; Lupus in  her cousin; Mental illness in her maternal aunt; OCD in her paternal grandmother; Obesity in her mother; Prostate cancer in her paternal grandfather; Sickle cell trait in her father. Social History:  She reports that  has never smoked. she has never used smokeless tobacco. She reports that she drinks about 0.6 oz of alcohol per week. She reports that she does not use drugs.  Review of Systems: Review of Systems  Constitutional: Negative for malaise/fatigue and weight loss.  HENT: Negative for hearing loss and tinnitus.   Eyes: Negative for blurred vision and double vision.  Respiratory: Negative for cough, sputum production, shortness of breath and wheezing.    Cardiovascular: Negative for chest pain, palpitations, orthopnea, claudication, leg swelling and PND.  Gastrointestinal: Negative for abdominal pain, blood in stool, constipation, diarrhea, heartburn, melena, nausea and vomiting.  Genitourinary: Negative.   Musculoskeletal: Negative for falls, joint pain and myalgias.  Skin: Negative for rash.  Neurological: Negative for dizziness, tingling, sensory change, weakness and headaches.  Endo/Heme/Allergies: Negative for polydipsia.  Psychiatric/Behavioral: Negative.  Negative for depression, memory loss, substance abuse and suicidal ideas. The patient is not nervous/anxious and does not have insomnia.   All other systems reviewed and are negative.   Physical Exam: Estimated body mass index is 39.07 kg/m as calculated from the following:   Height as of this encounter: 5' 4.75" (1.645 m).   Weight as of this encounter: 233 lb (105.7 kg). BP 110/70   Pulse 82   Temp (!) 97.5 F (36.4 C)   Ht 5' 4.75" (1.645 m)   Wt 233 lb (105.7 kg)   LMP 04/18/2017   SpO2 99%   BMI 39.07 kg/m  General Appearance: Well nourished, in no apparent distress.  Eyes: PERRLA, EOMs, conjunctiva no swelling or erythema, normal fundi and vessels.  Sinuses: No Frontal/maxillary tenderness  ENT/Mouth: Ext aud canals clear, normal light reflex with TMs without erythema, bulging. Good dentition. No erythema, swelling, or exudate on post pharynx. Tonsils not swollen or erythematous. Hearing normal.  Neck: Supple, thyroid normal. No bruits  Respiratory: Respiratory effort normal, BS equal bilaterally without rales, rhonchi, wheezing or stridor.  Cardio: RRR without murmurs, rubs or gallops. Brisk peripheral pulses without edema.  Chest: symmetric, with normal excursions and percussion.  Breasts: Symmetric, without lumps, nipple discharge, retractions.  Abdomen: Soft, nontender, no guarding, rebound, hernias, masses, or organomegaly.  Lymphatics: Non tender without  lymphadenopathy.  Genitourinary: Defer Musculoskeletal: Full ROM all peripheral extremities,5/5 strength, and normal gait.  Skin: Warm, dry without rashes, lesions, ecchymosis. Neuro: Cranial nerves intact, reflexes equal bilaterally. Normal muscle tone, no cerebellar symptoms. Sensation intact.  Psych: Awake and oriented X 3, depressed to normal affect, smiles/laughs with interaction, Insight and Judgment appropriate.   EKG: Baseline in system, Defer-  Dan Maker 5:27 PM [facility] Adult [facility] Adolescent Internal Medicine

## 2017-05-16 ENCOUNTER — Encounter: Payer: Self-pay | Admitting: Adult Health

## 2017-05-16 ENCOUNTER — Ambulatory Visit (INDEPENDENT_AMBULATORY_CARE_PROVIDER_SITE_OTHER): Payer: BLUE CROSS/BLUE SHIELD | Admitting: Adult Health

## 2017-05-16 VITALS — BP 110/70 | HR 82 | Temp 97.5°F | Ht 64.75 in | Wt 233.0 lb

## 2017-05-16 DIAGNOSIS — Z79899 Other long term (current) drug therapy: Secondary | ICD-10-CM | POA: Diagnosis not present

## 2017-05-16 DIAGNOSIS — Z131 Encounter for screening for diabetes mellitus: Secondary | ICD-10-CM

## 2017-05-16 DIAGNOSIS — E559 Vitamin D deficiency, unspecified: Secondary | ICD-10-CM

## 2017-05-16 DIAGNOSIS — D6861 Antiphospholipid syndrome: Secondary | ICD-10-CM

## 2017-05-16 DIAGNOSIS — Z0000 Encounter for general adult medical examination without abnormal findings: Secondary | ICD-10-CM | POA: Diagnosis not present

## 2017-05-16 DIAGNOSIS — D6862 Lupus anticoagulant syndrome: Secondary | ICD-10-CM

## 2017-05-16 DIAGNOSIS — D573 Sickle-cell trait: Secondary | ICD-10-CM

## 2017-05-16 DIAGNOSIS — Z1389 Encounter for screening for other disorder: Secondary | ICD-10-CM

## 2017-05-16 DIAGNOSIS — F332 Major depressive disorder, recurrent severe without psychotic features: Secondary | ICD-10-CM

## 2017-05-16 DIAGNOSIS — Z13220 Encounter for screening for lipoid disorders: Secondary | ICD-10-CM

## 2017-05-16 DIAGNOSIS — Z1329 Encounter for screening for other suspected endocrine disorder: Secondary | ICD-10-CM

## 2017-05-16 DIAGNOSIS — Z130 Encounter for screening for diseases of the blood and blood-forming organs and certain disorders involving the immune mechanism: Secondary | ICD-10-CM

## 2017-05-16 DIAGNOSIS — E669 Obesity, unspecified: Secondary | ICD-10-CM

## 2017-05-16 DIAGNOSIS — Z7901 Long term (current) use of anticoagulants: Secondary | ICD-10-CM | POA: Diagnosis not present

## 2017-05-16 MED ORDER — WARFARIN SODIUM 5 MG PO TABS: ORAL_TABLET | ORAL | 1 refills | Status: DC

## 2017-05-16 NOTE — Patient Instructions (Signed)
Try tracking your food intake in an app for 4 weeks to become familiar with calories in foods and how much carbs/fats/proteins you are getting in -   Lose it! App is free and have good experience with this  Recommend you plan meals ahead and have quick easy options to grab when you get hungry - such as frozen lentil/veggie pasta        Aim for 7+ servings of fruits and vegetables daily  80+ fluid ounces of water or unsweet tea for healthy kidneys  Limit alcohol intake (max 1 drink per day)  Limit animal fats in diet for cholesterol and heart health - choose grass fed whenever available  Aim for low stress - take time to unwind and care for your mental health  Aim for 150 min of moderate intensity exercise weekly for heart health, and weights twice weekly for bone health  Aim for 7-9 hours of sleep daily   Drink 1/2 your body weight in fluid ounces of water daily; drink a tall glass of water 30 min before meals  Don't eat until you're stuffed- listen to your stomach and eat until you are 80% full   Try eating off of a salad plate; wait 10 min after finishing before going back for seconds  Start by eating the vegetables on your plate; aim for 19%50% of your meals to be fruits or vegetables  Then eat your protein - lean meats (grass fed if possible), fish, beans, nuts in moderation  Eat your carbs/starch last ONLY if you still are hungry. If you can, stop before finishing it all  Avoid sugar and flour - the closer it looks to it's original form in nature, typically the better it is for you  Splurge in moderation - "assign" days when you get to splurge and have the "bad stuff" - I like to follow a 80% - 20% plan- "good" choices 80 % of the time, "bad" choices in moderation 20% of the time  Simple equation is: Calories out > calories in = weight loss - even if you eat the bad stuff, if you limit portions, you will still lose weight   Are you an emotional eater? Do you eat more  when you're feeling stressed? Do you eat when you're not hungry or when you're full? Do you eat to feel better (to calm and soothe yourself when you're sad, mad, bored, anxious, etc.)? Do you reward yourself with food? Do you regularly eat until you've stuffed yourself? Does food make you feel safe? Do you feel like food is a friend? Do you feel powerless or out of control around food?  If you answered yes to some of these questions than it is likely that you are an emotional eater. This is normally a learned behavior and can take time to first recognize the signs and second BREAK THE HABIT. But here is more information and tips to help.   The difference between emotional hunger and physical hunger Emotional hunger can be powerful, so it's easy to mistake it for physical hunger. But there are clues you can look for to help you tell physical and emotional hunger apart.  Emotional hunger comes on suddenly. It hits you in an instant and feels overwhelming and urgent. Physical hunger, on the other hand, comes on more gradually. The urge to eat doesn't feel as dire or demand instant satisfaction (unless you haven't eaten for a very long time).  Emotional hunger craves specific comfort foods. When you're  physically hungry, almost anything sounds good-including healthy stuff like vegetables. But emotional hunger craves junk food or sugary snacks that provide an instant rush. You feel like you need cheesecake or pizza, and nothing else will do.  Emotional hunger often leads to mindless eating. Before you know it, you've eaten a whole bag of chips or an entire pint of ice cream without really paying attention or fully enjoying it. When you're eating in response to physical hunger, you're typically more aware of what you're doing.  Emotional hunger isn't satisfied once you're full. You keep wanting more and more, often eating until you're uncomfortably stuffed. Physical hunger, on the other hand, doesn't  need to be stuffed. You feel satisfied when your stomach is full.  Emotional hunger isn't located in the stomach. Rather than a growling belly or a pang in your stomach, you feel your hunger as a craving you can't get out of your head. You're focused on specific textures, tastes, and smells.  Emotional hunger often leads to regret, guilt, or shame. When you eat to satisfy physical hunger, you're unlikely to feel guilty or ashamed because you're simply giving your body what it needs. If you feel guilty after you eat, it's likely because you know deep down that you're not eating for nutritional reasons.  Identify your emotional eating triggers What situations, places, or feelings make you reach for the comfort of food? Most emotional eating is linked to unpleasant feelings, but it can also be triggered by positive emotions, such as rewarding yourself for achieving a goal or celebrating a holiday or happy event. Common causes of emotional eating include:  Stuffing emotions - Eating can be a way to temporarily silence or "stuff down" uncomfortable emotions, including anger, fear, sadness, anxiety, loneliness, resentment, and shame. While you're numbing yourself with food, you can avoid the difficult emotions you'd rather not feel.  Boredom or feelings of emptiness - Do you ever eat simply to give yourself something to do, to relieve boredom, or as a way to fill a void in your life? You feel unfulfilled and empty, and food is a way to occupy your mouth and your time. In the moment, it fills you up and distracts you from underlying feelings of purposelessness and dissatisfaction with your life.  Childhood habits - Think back to your childhood memories of food. Did your parents reward good behavior with ice cream, take you out for pizza when you got a good report card, or serve you sweets when you were feeling sad? These habits can often carry over into adulthood. Or your eating may be driven by nostalgia-for  cherished memories of grilling burgers in the backyard with your dad or baking and eating cookies with your mom.  Social influences - Getting together with other people for a meal is a great way to relieve stress, but it can also lead to overeating. It's easy to overindulge simply because the food is there or because everyone else is eating. You may also overeat in social situations out of nervousness. Or perhaps your family or circle of friends encourages you to overeat, and it's easier to go along with the group.  Stress - Ever notice how stress makes you hungry? It's not just in your mind. When stress is chronic, as it so often is in our chaotic, fast-paced world, your body produces high levels of the stress hormone, cortisol. Cortisol triggers cravings for salty, sweet, and fried foods-foods that give you a burst of energy and pleasure. The  more uncontrolled stress in your life, the more likely you are to turn to food for emotional relief.  Find other ways to feed your feelings If you don't know how to manage your emotions in a way that doesn't involve food, you won't be able to control your eating habits for very long. Diets so often fail because they offer logical nutritional advice which only works if you have conscious control over your eating habits. It doesn't work when emotions hijack the process, demanding an immediate payoff with food.  In order to stop emotional eating, you have to find other ways to fulfill yourself emotionally. It's not enough to understand the cycle of emotional eating or even to understand your triggers, although that's a huge first step. You need alternatives to food that you can turn to for emotional fulfillment.  Alternatives to emotional eating If you're depressed or lonely, call someone who always makes you feel better, play with your dog or cat, or look at a favorite photo or cherished memento.  If you're anxious, expend your nervous energy by dancing to your  favorite song, squeezing a stress ball, or taking a brisk walk.  If you're exhausted, treat yourself with a hot cup of tea, take a bath, light some scented candles, or wrap yourself in a warm blanket.  If you're bored, read a good book, watch a comedy show, explore the outdoors, or turn to an activity you enjoy (woodworking, playing the guitar, shooting hoops, scrapbooking, etc.).  What is mindful eating? Mindful eating is a practice that develops your awareness of eating habits and allows you to pause between your triggers and your actions. Most emotional eaters feel powerless over their food cravings. When the urge to eat hits, you feel an almost unbearable tension that demands to be fed, right now. Because you've tried to resist in the past and failed, you believe that your willpower just isn't up to snuff. But the truth is that you have more power over your cravings than you think.  Take 5 before you give in to a craving Emotional eating tends to be automatic and virtually mindless. Before you even realize what you're doing, you've reached for a tub of ice cream and polished off half of it. But if you can take a moment to pause and reflect when you're hit with a craving, you give yourself the opportunity to make a different decision.  Can you put off eating for five minutes? Or just start with one minute. Don't tell yourself you can't give in to the craving; remember, the forbidden is extremely tempting. Just tell yourself to wait.  While you're waiting, check in with yourself. How are you feeling? What's going on emotionally? Even if you end up eating, you'll have a better understanding of why you did it. This can help you set yourself up for a different response next time.  How to practice mindful eating Eating while you're also doing other things-such as watching TV, driving, or playing with your phone-can prevent you from fully enjoying your food. Since your mind is elsewhere, you may not  feel satisfied or continue eating even though you're no longer hungry. Eating more mindfully can help focus your mind on your food and the pleasure of a meal and curb overeating.   Eat your meals in a calm place with no distractions, aside from any dining companions.  Try eating with your non-dominant hand or using chopsticks instead of a knife and fork. Eating in such a  non-familiar way can slow down how fast you eat and ensure your mind stays focused on your food.  Allow yourself enough time not to have to rush your meal. Set a timer for 20 minutes and pace yourself so you spend at least that much time eating.  Take small bites and chew them well, taking time to notice the different flavors and textures of each mouthful.  Put your utensils down between bites. Take time to consider how you feel-hungry, satiated-before picking up your utensils again.  Try to stop eating before you are full.It takes time for the signal to reach your brain that you've had enough. Don't feel obligated to always clean your plate.  When you've finished your food, take a few moments to assess if you're really still hungry before opting for an extra serving or dessert.  Learn to accept your feelings-even the bad ones  While it may seem that the core problem is that you're powerless over food, emotional eating actually stems from feeling powerless over your emotions. You don't feel capable of dealing with your feelings head on, so you avoid them with food.  Recommended reading  Mini Habits for weight loss  Healthy Eating: A guide to the new nutrition Copy [facility] Report  10 Tips for Mindful Eating - How mindfulness can help you fully enjoy a meal and the experience of eating-with moderation and restraint. [facility] blog)  Weight Loss: Gain Control of Emotional Eating - Tips to regain control of your eating habits. [facility])  Why Stress Causes People to Overeat -Tips on  controlling stress eating. [employer])  Mindful Eating Meditations -Free online mindfulness meditations. ([facility])

## 2017-05-17 LAB — BASIC METABOLIC PANEL WITH GFR
BUN: 8 mg/dL (ref 7–25)
CALCIUM: 9.2 mg/dL (ref 8.6–10.2)
CO2: 26 mmol/L (ref 20–32)
Chloride: 105 mmol/L (ref 98–110)
Creat: 0.66 mg/dL (ref 0.50–1.10)
GFR, EST AFRICAN AMERICAN: 145 mL/min/{1.73_m2} (ref >=60)
GFR, Est Non African American: 125 mL/min/{1.73_m2} (ref >=60)
Glucose, Bld: 78 mg/dL (ref 65–99)
Potassium: 4.4 mmol/L (ref 3.5–5.3)
Sodium: 138 mmol/L (ref 135–146)

## 2017-05-17 LAB — VITAMIN D 25 HYDROXY (VIT D DEFICIENCY, FRACTURES): VIT D 25 HYDROXY: 22 ng/mL — AB (ref 30–100)

## 2017-05-17 LAB — HEPATIC FUNCTION PANEL
AG Ratio: 1.5 (calc) (ref 1.0–2.5)
ALBUMIN MSPROF: 4.2 g/dL (ref 3.6–5.1)
ALT: 20 U/L (ref 6–29)
AST: 18 U/L (ref 10–30)
Alkaline phosphatase (APISO): 55 U/L (ref 33–115)
BILIRUBIN DIRECT: 0.1 mg/dL (ref 0.0–0.2)
GLOBULIN: 2.8 g/dL (ref 1.9–3.7)
Indirect Bilirubin: 0.2 mg/dL (calc) (ref 0.2–1.2)
TOTAL PROTEIN: 7 g/dL (ref 6.1–8.1)
Total Bilirubin: 0.3 mg/dL (ref 0.2–1.2)

## 2017-05-17 LAB — URINALYSIS W MICROSCOPIC + REFLEX CULTURE
BILIRUBIN URINE: NEGATIVE
GLUCOSE, UA: NEGATIVE
HYALINE CAST: NONE SEEN /LPF
Hgb urine dipstick: NEGATIVE
Ketones, ur: NEGATIVE
Leukocyte Esterase: NEGATIVE
NITRITES URINE, INITIAL: NEGATIVE
PROTEIN: NEGATIVE
SPECIFIC GRAVITY, URINE: 1.019 (ref 1.001–1.03)
pH: 6 (ref 5.0–8.0)

## 2017-05-17 LAB — HEMOGLOBIN A1C
Hgb A1c MFr Bld: 5.4 % of total Hgb (ref <5.7)
Mean Plasma Glucose: 108 (calc)
eAG (mmol/L): 6 (calc)

## 2017-05-17 LAB — CBC WITH DIFFERENTIAL/PLATELET
BASOS ABS: 22 {cells}/uL (ref 0–200)
Basophils Relative: 0.5 %
EOS ABS: 48 {cells}/uL (ref 15–500)
Eosinophils Relative: 1.1 %
HEMATOCRIT: 35.7 % (ref 35.0–45.0)
Hemoglobin: 12.2 g/dL (ref 11.7–15.5)
LYMPHS ABS: 1465 {cells}/uL (ref 850–3900)
MCH: 26.3 pg — AB (ref 27.0–33.0)
MCHC: 34.2 g/dL (ref 32.0–36.0)
MCV: 76.9 fL — AB (ref 80.0–100.0)
MPV: 9.7 fL (ref 7.5–12.5)
Monocytes Relative: 9.6 %
NEUTROS PCT: 55.5 %
Neutro Abs: 2442 cells/uL (ref 1500–7800)
PLATELETS: 391 10*3/uL (ref 140–400)
RBC: 4.64 10*6/uL (ref 3.80–5.10)
RDW: 14.5 % (ref 11.0–15.0)
TOTAL LYMPHOCYTE: 33.3 %
WBC: 4.4 10*3/uL (ref 3.8–10.8)
WBCMIX: 422 {cells}/uL (ref 200–950)

## 2017-05-17 LAB — PROTIME-INR
INR: 1.6 — AB
PROTHROMBIN TIME: 16.9 s — AB (ref 9.0–11.5)

## 2017-05-17 LAB — MICROALBUMIN / CREATININE URINE RATIO
CREATININE, URINE: 151 mg/dL (ref 20–275)
MICROALB/CREAT RATIO: 3 ug/mg{creat} (ref <30)
Microalb, Ur: 0.4 mg/dL

## 2017-05-17 LAB — LIPID PANEL
CHOL/HDL RATIO: 3.8 (calc) (ref <5.0)
CHOLESTEROL: 169 mg/dL (ref <200)
HDL: 44 mg/dL — AB (ref >50)
LDL CHOLESTEROL (CALC): 110 mg/dL — AB
Non-HDL Cholesterol (Calc): 125 mg/dL (calc) (ref <130)
TRIGLYCERIDES: 68 mg/dL (ref <150)

## 2017-05-17 LAB — NO CULTURE INDICATED

## 2017-05-17 LAB — TSH: TSH: 1.67 mIU/L

## 2017-05-17 LAB — VITAMIN B12: Vitamin B-12: 388 pg/mL (ref 200–1100)

## 2017-06-19 ENCOUNTER — Ambulatory Visit: Payer: Self-pay

## 2017-06-20 ENCOUNTER — Ambulatory Visit (INDEPENDENT_AMBULATORY_CARE_PROVIDER_SITE_OTHER): Payer: BLUE CROSS/BLUE SHIELD

## 2017-06-20 ENCOUNTER — Other Ambulatory Visit: Payer: Self-pay | Admitting: Adult Health

## 2017-06-20 DIAGNOSIS — Z7901 Long term (current) use of anticoagulants: Secondary | ICD-10-CM | POA: Diagnosis not present

## 2017-06-20 NOTE — Progress Notes (Signed)
Patient presented to [facility] for coumadin management/lab recheck reporting she has stopped taking emsam for depression and requesting possible suggestion for alternate agent. This was discussed at length at her recent CPE; is typically managed by psych provider, but has tried zoloft, effexor, lamictal, wellbutrin reportedly without notable benefit. We discussed newer agents including trintellix at that time. We will provide her with trintellix 5 mg samples x 7 day supply to start with as well as 10 mg x 7 day sample to take on week 2; patient to call back if tolerating well and can provider prescription. Encouraged her to follow up with mental health provider as well for ongoing depression.

## 2017-06-20 NOTE — Progress Notes (Signed)
Patient presents to the office to have INR checked. Current coumadin dose in 2 tablets M-Sat, and 1.5 tablets on Sundays. Patient is not currently taking any antibiotics, no falls and hasn't missed any doses.  Patient also states that she is no longer taking Emsam and would like to try something new. Patient was given a sample of Trintellix, 5mg  for 1 wk and 10mg  for the following week.

## 2017-06-21 LAB — PROTIME-INR
INR: 1.3 — AB
Prothrombin Time: 14 s — ABNORMAL HIGH (ref 9.0–11.5)

## 2017-07-04 ENCOUNTER — Other Ambulatory Visit: Payer: Self-pay

## 2017-07-04 ENCOUNTER — Other Ambulatory Visit: Payer: BLUE CROSS/BLUE SHIELD

## 2017-07-04 DIAGNOSIS — Z7901 Long term (current) use of anticoagulants: Secondary | ICD-10-CM

## 2017-07-04 NOTE — Progress Notes (Signed)
ptp

## 2017-07-05 ENCOUNTER — Encounter (INDEPENDENT_AMBULATORY_CARE_PROVIDER_SITE_OTHER): Payer: Self-pay

## 2017-07-05 ENCOUNTER — Ambulatory Visit: Payer: Self-pay | Admitting: Adult Health

## 2017-07-05 ENCOUNTER — Other Ambulatory Visit: Payer: Self-pay | Admitting: Adult Health

## 2017-07-05 DIAGNOSIS — D6861 Antiphospholipid syndrome: Secondary | ICD-10-CM

## 2017-07-05 LAB — PROTIME-INR
INR: 2 — AB
Prothrombin Time: 20.8 s — ABNORMAL HIGH (ref 9.0–11.5)

## 2017-07-09 ENCOUNTER — Telehealth: Payer: Self-pay

## 2017-07-09 ENCOUNTER — Other Ambulatory Visit: Payer: Self-pay | Admitting: Adult Health

## 2017-07-09 DIAGNOSIS — F332 Major depressive disorder, recurrent severe without psychotic features: Secondary | ICD-10-CM

## 2017-07-09 MED ORDER — VORTIOXETINE HBR 20 MG PO TABS: 20.0000 mg | ORAL_TABLET | Freq: Every day | ORAL | 0 refills | Status: AC

## 2017-07-09 NOTE — Telephone Encounter (Signed)
Refill request for Trintellix.

## 2017-07-10 NOTE — Telephone Encounter (Signed)
Patient aware that Rx was not covered and she would have to call her mental health care provider for this

## 2017-07-19 ENCOUNTER — Ambulatory Visit: Payer: Self-pay

## 2017-07-25 ENCOUNTER — Ambulatory Visit (INDEPENDENT_AMBULATORY_CARE_PROVIDER_SITE_OTHER): Payer: BLUE CROSS/BLUE SHIELD

## 2017-07-25 DIAGNOSIS — D6861 Antiphospholipid syndrome: Secondary | ICD-10-CM

## 2017-07-25 NOTE — Progress Notes (Signed)
Pt reports for PT/INR per ([facility]) Pt reports taking her COUMADIN as follows:  3 days of the wk she takes-------one and half tablet (7.5mg s)  4 days of the wk she takes--------two tablets ( s)

## 2017-07-26 LAB — PROTIME-INR
INR: 3.1 — ABNORMAL HIGH
PROTHROMBIN TIME: 32.3 s — AB (ref 9.0–11.5)

## 2017-08-14 ENCOUNTER — Other Ambulatory Visit: Payer: Self-pay | Admitting: Adult Health

## 2017-08-14 ENCOUNTER — Telehealth: Payer: Self-pay

## 2017-08-14 DIAGNOSIS — D6862 Lupus anticoagulant syndrome: Secondary | ICD-10-CM

## 2017-08-14 MED ORDER — WARFARIN SODIUM 5 MG PO TABS: ORAL_TABLET | ORAL | 1 refills | Status: AC

## 2017-08-14 NOTE — Telephone Encounter (Signed)
[place] and is requesting a 2 month supply of Warfarin be called in until she can find a new PCP.

## 2017-08-17 ENCOUNTER — Other Ambulatory Visit: Payer: Self-pay

## 2017-08-17 ENCOUNTER — Other Ambulatory Visit: Payer: BLUE CROSS/BLUE SHIELD

## 2017-08-17 DIAGNOSIS — D6862 Lupus anticoagulant syndrome: Secondary | ICD-10-CM

## 2017-08-17 NOTE — Progress Notes (Unsigned)
P 

## 2017-08-18 LAB — PROTIME-INR
INR: 2.5 — ABNORMAL HIGH
Prothrombin Time: 25.9 s — ABNORMAL HIGH (ref 9.0–11.5)

## 2017-08-23 ENCOUNTER — Ambulatory Visit: Payer: Self-pay | Admitting: Adult Health

## 2017-09-13 ENCOUNTER — Encounter: Payer: Self-pay | Admitting: Adult Health

## 2017-09-17 ENCOUNTER — Encounter: Payer: Self-pay | Admitting: Adult Health

## 2018-05-20 ENCOUNTER — Encounter: Payer: Self-pay | Admitting: Adult Health
